# Patient Record
Sex: Male | Born: 1980 | Race: White | Hispanic: No | Marital: Married | Smoking: Current every day smoker
Health system: Southern US, Community
[De-identification: ages and names within clinical notes are randomized; demographics above are authoritative.]

## PROBLEM LIST (undated history)

## (undated) DIAGNOSIS — I82409 Acute embolism and thrombosis of unspecified deep veins of unspecified lower extremity: Secondary | ICD-10-CM

## (undated) DIAGNOSIS — B019 Varicella without complication: Secondary | ICD-10-CM

## (undated) DIAGNOSIS — N200 Calculus of kidney: Secondary | ICD-10-CM

## (undated) DIAGNOSIS — I10 Essential (primary) hypertension: Secondary | ICD-10-CM

## (undated) DIAGNOSIS — K219 Gastro-esophageal reflux disease without esophagitis: Secondary | ICD-10-CM

## (undated) HISTORY — DX: Morbid (severe) obesity due to excess calories: E66.01

## (undated) HISTORY — DX: Acute embolism and thrombosis of unspecified deep veins of unspecified lower extremity: I82.409

## (undated) HISTORY — DX: Calculus of kidney: N20.0

## (undated) HISTORY — DX: Varicella without complication: B01.9

## (undated) HISTORY — DX: Gastro-esophageal reflux disease without esophagitis: K21.9

## (undated) HISTORY — PX: KNEE ARTHROSCOPY W/ MENISCAL REPAIR: SHX1877

---

## 2009-07-07 ENCOUNTER — Ambulatory Visit: Payer: Self-pay | Admitting: Family Medicine

## 2009-07-07 DIAGNOSIS — I10 Essential (primary) hypertension: Secondary | ICD-10-CM | POA: Insufficient documentation

## 2010-03-01 NOTE — Assessment & Plan Note (Signed)
Summary: NOV HTN/ obesity   Vital Signs:  Patient profile:   30 year old male Height:      72 inches Weight:      408 pounds BMI:     55.53 O2 Sat:      99 % on Room air Pulse rate:   96 / minute BP sitting:   122 / 79  (left arm) Cuff size:   large  Vitals Entered By: Payton Spark CMA (July 07, 2009 10:16 AM)  O2 Flow:  Room air CC: New to est. Refill BP med.   Primary Care Provider:  Seymour Bars DO  CC:  New to est. Refill BP med.Marland Kitchen  History of Present Illness: 30 yo WM presents for NOV.  He has been on BP meds x 1 yr.  He was previously seen by Dr Renold Don but he changed insurance.  He is otherwise healthy other than morbid obesity.  It has been <1 yr since he had labs drawn.  He works long hrs and has poor eating habits and no exercise.  Has lost 25 lbs in the past 3 months.  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  Current Medications (verified): 1)  Lisinopril-Hydrochlorothiazide 20-12.5 Mg Tabs (Lisinopril-Hydrochlorothiazide) .... Take 1 Tab By Mouth Once Daily  Allergies (verified): 1)  ! Morphine  Past History:  Past Medical History: obesity HTN  Past Surgical History: L knee arthroscopic surgery  Family History: father HTN, high BP, high chol, DM, CAD, obese mother died in accident sister obese  Social History: Works in Location manager. Finished HS. Married. No kids. Quit smoking 02. No exercise. 1 ETOH/ wk.    Review of Systems       no fevers/sweats/weakness, unexplained wt loss/gain, no change in vision, no difficulty hearing, ringing in ears, no hay fever/allergies, no CP/discomfort, no palpitations, no breast lump/nipple discharge, no cough/wheeze, no blood in stool, no N/V/D, no nocturia, no leaking urine, no unusual vag bleeding, no vaginal/penile discharge, no muscle/joint pain, no rash, no new/changing mole, no HA, no memory loss, no anxiety, no sleep problem, no depression, no unexplained lumps, no easy bruising/bleeding, no concern with sexual  function   Physical Exam  General:  morbidly obese WM in NAD Head:  normocephalic and atraumatic.   Eyes:  pupils equal, pupils round, and pupils reactive to light.   Nose:  no nasal discharge.   Mouth:  pharynx pink and moist.   Neck:  no masses.   Lungs:  normal respiratory effort, no intercostal retractions, and no accessory muscle use.   Heart:  normal rate, regular rhythm, and no murmur.   Extremities:  1+ pitting leg edema bilat Skin:  color normal.   Psych:  good eye contact, not anxious appearing, and not depressed appearing.     Impression & Recommendations:  Problem # 1:  ESSENTIAL HYPERTENSION, BENIGN (ICD-401.1) At goal.  Continue meds.  Not due for labs yet. His updated medication list for this problem includes:    Lisinopril-hydrochlorothiazide 20-12.5 Mg Tabs (Lisinopril-hydrochlorothiazide) .Marland Kitchen... Take 1 tab by mouth once daily  BP today: 122/79  Problem # 2:  MORBID OBESITY (ICD-278.01) BMI 55 c/w morbid obesity. Focus on physical activity and healthier diet. F/u in 3 mos to see how he is doing. Has lost 25 lbs already.  Complete Medication List: 1)  Lisinopril-hydrochlorothiazide 20-12.5 Mg Tabs (Lisinopril-hydrochlorothiazide) .... Take 1 tab by mouth once daily  Patient Instructions: 1)  Stay on BP meds, RFd today. 2)  Will get  your old records for last set of labs. 3)  Keep up the good work with healthy diet.   Avoid fast food, soda and late night snacking. 4)  Try to get as much physical activity each day as you can. 5)  Return for f/u wt/ BP in 3 mos. Prescriptions: LISINOPRIL-HYDROCHLOROTHIAZIDE 20-12.5 MG TABS (LISINOPRIL-HYDROCHLOROTHIAZIDE) Take 1 tab by mouth once daily  #30 x 3   Entered and Authorized by:   Seymour Bars DO   Signed by:   Seymour Bars DO on 07/07/2009   Method used:   Electronically to        [facility]* (retail)       [address]       [place], [place]  [address]       Ph: [phone]       Fax:  [phone]   RxID:   [id number]

## 2015-05-06 ENCOUNTER — Emergency Department: Payer: 59

## 2015-05-06 ENCOUNTER — Emergency Department
Admission: EM | Admit: 2015-05-06 | Discharge: 2015-05-06 | Disposition: A | Discharge status: Home / Self Care | Payer: 59 | Source: Home / Self Care | Attending: Family Medicine | Admitting: Family Medicine

## 2015-05-06 DIAGNOSIS — I8002 Phlebitis and thrombophlebitis of superficial vessels of left lower extremity: Secondary | ICD-10-CM

## 2015-05-06 HISTORY — DX: Essential (primary) hypertension: I10

## 2015-05-06 MED ORDER — DOXYCYCLINE HYCLATE 100 MG PO CAPS: 100.0000 mg | ORAL_CAPSULE | Freq: Two times a day (BID) | ORAL | Status: DC

## 2015-05-06 NOTE — ED Provider Notes (Signed)
CSN: [id number]     Arrival date & time 05/06/15  [id number] History   First MD Initiated Contact with Patient 05/06/15 1023     Chief Complaint  Patient presents with  . Leg Pain      HPI Comments: After working in his yard four days ago, patient developed superficial pain, swelling, and redness of his left lower leg.  No chest pain or shortness of breath.  No fevers, chills, and sweats. He has a history of venous varicosities left lower leg.  He reports that he has had an SVT in the past, but his present symptoms are worse.    Patient is a 35 y.o. male presenting with leg pain. The history is provided by the patient.  Leg Pain Location:  Leg Time since incident:  4 days Injury: no   Leg location:  L lower leg Pain details:    Quality:  Aching   Radiates to:  Does not radiate   Severity:  Mild   Onset quality:  Gradual   Duration:  4 days   Timing:  Constant   Progression:  Worsening Chronicity:  New Prior injury to area:  No Relieved by:  Nothing Worsened by:  Bearing weight Ineffective treatments:  NSAIDs Associated symptoms: swelling   Associated symptoms: no decreased ROM, no fatigue, no fever, no muscle weakness, no numbness, no stiffness and no tingling   Risk factors: obesity     Past Medical History  Diagnosis Date  . Hypertension    Past Surgical History  Procedure Laterality Date  . Knee arthroscopy w/ meniscal repair      Left   Family History  Problem Relation Age of Onset  . Diabetes Father   . Hypertension Father    Social History  Substance Use Topics  . Smoking status: Never Smoker   . Smokeless tobacco: Never Used  . Alcohol Use: No    Review of Systems  Constitutional: Negative for fever, chills, diaphoresis and fatigue.  Respiratory: Negative for cough, chest tightness, shortness of breath and wheezing.   Cardiovascular: Positive for leg swelling. Negative for chest pain.  Musculoskeletal: Negative for stiffness.  All other systems reviewed and  are negative.   Allergies  Morphine  Home Medications   Prior to Admission medications   Medication Sig Start Date End Date Taking? Authorizing Provider  hydrochlorothiazide (HYDRODIURIL) 12.5 MG tablet Take 12.5 mg by mouth daily.   Yes Historical Provider, MD  doxycycline (VIBRAMYCIN) 100 MG capsule Take 1 capsule (100 mg total) by mouth 2 (two) times daily. Take with food. 05/06/15   Kandra Nicolas, MD   Meds Ordered and Administered this Visit  Medications - No data to display  BP 152/95 mmHg  Pulse 91  Temp(Src) 97.5 F (36.4 C) (Oral)  Ht 6' (1.829 m)  Wt 434 lb (196.861 kg)  BMI 58.85 kg/m2  SpO2 100% No data found.   Physical Exam  Constitutional: He is oriented to person, place, and time. He appears well-developed and well-nourished. No distress.  Patient is morbidly obese (BMI 58.9)   HENT:  Head: Normocephalic.  Nose: Nose normal.  Mouth/Throat: Oropharynx is clear and moist.  Eyes: Conjunctivae are normal. Pupils are equal, round, and reactive to light.  Neck: Neck supple.  Cardiovascular: Normal heart sounds.   Pulmonary/Chest: Breath sounds normal.  Abdominal: There is no tenderness.  Musculoskeletal:       Left lower leg: He exhibits tenderness and swelling. He exhibits no bony tenderness.  Legs: There is swelling, erythema, and tenderness around a superficial varicosity left lower leg as noted on diagram. Although there is no left calf tenderness to palpation, patient's left lower leg is generally more swollen and tense than his right lower leg.  Multiple left superficial varicosities are present.    Neurological: He is alert and oriented to person, place, and time.  Skin: Skin is warm and dry.  Nursing note and vitals reviewed.   ED Course  Procedures none  Imaging Review US Venous Img Lower Unilateral Left  05/06/2015  CLINICAL DATA:  Left lower extremity pain and swelling. History of popliteal DVT in 2016. EXAM: Left LOWER EXTREMITY VENOUS  DOPPLER ULTRASOUND TECHNIQUE: Gray-scale sonography with graded compression, as well as color Doppler and duplex ultrasound were performed to evaluate the lower extremity deep venous systems from the level of the common femoral vein and including the common femoral, femoral, profunda femoral, popliteal and calf veins including the posterior tibial, peroneal and gastrocnemius veins when visible. The superficial great saphenous vein was also interrogated. Spectral Doppler was utilized to evaluate flow at rest and with distal augmentation maneuvers in the common femoral, femoral and popliteal veins. COMPARISON:  None. FINDINGS: Contralateral Common Femoral Vein: Respiratory phasicity is normal and symmetric with the symptomatic side. No evidence of thrombus. Normal compressibility. Common Femoral Vein: No evidence of thrombus. Saphenofemoral Junction: No evidence of thrombus. Profunda Femoral Vein: No evidence of thrombus. Femoral Vein: No evidence of thrombus. Popliteal Vein: No evidence of thrombus. Deep calf Veins: No evidence of thrombus. In the symptomatic area is subcutaneous calf varices complicated by occlusive thrombosis with echogenic expansile clot. IMPRESSION: 1. 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 2. No evidence of left lower extremity deep venous thrombosis. Electronically Signed   By: Monte Fantasia M.D.   On: 05/06/2015 12:08      MDM   1. Superficial thrombophlebitis of leg, left; no evidence DVT   Begin doxycycline 100mg  BID for staph coverage. Elevate leg as much as possible.  Apply heating pad every 2 to 3 hours until improved.  Take Ibuprofen 200mg , 4 tabs every 8 hours with food.  If symptoms become significantly worse during the night or over the weekend, proceed to the local emergency room.  Followup with Family Doctor if not improved in one week.     Kandra Nicolas, MD 05/06/15 [id number]

## 2015-05-06 NOTE — ED Notes (Signed)
Cleaning the house and mowing on Sunday, Spot midwaybetween left knee and ankle became red, hot, and sensitive to the touch.  Has a varicose vein that becomes sensitive before, but hurt much worse this time.  Less painful today than yesterday, and patient stated there was more redness yesterday than today.Marland Kitchen

## 2015-05-06 NOTE — Discharge Instructions (Signed)
Elevate leg as much as possible.  Apply heating pad every 2 to 3 hours until improved.  Take Ibuprofen 200mg , 4 tabs every 8 hours with food.  If symptoms become significantly worse during the night or over the weekend, proceed to the local emergency room.    Phlebitis Phlebitis is soreness and swelling (inflammation) of a vein. This can occur in your arms, legs, or torso (trunk), as well as deeper inside your body. Phlebitis is usually not serious when it occurs close to the surface of the body. However, it can cause serious problems when it occurs in a vein deeper inside the body. CAUSES  Phlebitis can be triggered by various things, including:   Reduced blood flow through your veins. This can happen with:  Bed rest over a long period.  Long-distance travel.  Injury.  Surgery.  Being overweight (obese) or pregnant.  Having an IV tube put in the vein and getting certain medicines through the vein.  Cancer and cancer treatment.  Use of illegal drugs taken through the vein.  Inflammatory diseases.  Inherited (genetic) diseases that increase the risk of blood clots.  Hormone therapy, such as birth control pills. SIGNS AND SYMPTOMS   Red, tender, swollen, and painful area on your skin. Usually, the area will be long and narrow.  Firmness along the center of the affected area. This can indicate that a blood clot has formed.  Low-grade fever. DIAGNOSIS  A health care provider can usually diagnose phlebitis by examining the affected area and asking about your symptoms. To check for infection or blood clots, your health care provider may order blood tests or an ultrasound exam of the area. Blood tests and your family history may also indicate if you have an underlying genetic disease that causes blood clots. Occasionally, a piece of tissue is taken from the body (biopsy sample) if an unusual cause of phlebitis is suspected. TREATMENT  Treatment will vary depending on the severity  of the condition and the area of the body affected. Treatment may include:  Use of a warm compress or heating pad.  Use of compression stockings or bandages.  Anti-inflammatory medicines.  Removal of any IV tube that may be causing the problem.  Medicines that kill germs (antibiotics) if an infection is present.  Blood-thinning medicines if a blood clot is suspected or present.  In rare cases, surgery may be needed to remove damaged sections of vein. HOME CARE INSTRUCTIONS   Only take over-the-counter or prescription medicines as directed by your health care provider. Take all medicines exactly as prescribed.  Raise (elevate) the affected area above the level of your heart as directed by your health care provider.  Apply a warm compress or heating pad to the affected area as directed by your health care provider. Do not sleep with the heating pad.  Use compression stockings or bandages as directed. These will speed healing and prevent the condition from coming back.  If you are on blood thinners:  Get follow-up blood tests as directed by your health care provider.  Check with your health care provider before using any new medicines.  Carry a medical alert card or wear your medical alert jewelry to show that you are on blood thinners.  For phlebitis in the legs:  Avoid prolonged standing or bed rest.  Keep your legs moving. Raise your legs when sitting or lying.  Do not smoke.  Women, particularly those over the age of 67, should consider the risks and benefits  of taking the contraceptive pill. This kind of hormone treatment can increase your risk for blood clots.  Follow up with your health care provider as directed. SEEK MEDICAL CARE IF:   You have unusual bruising or any bleeding problems.  Your swelling or pain in the affected area is not improving.  You are on anti-inflammatory medicine, and you develop belly (abdominal) pain. SEEK IMMEDIATE MEDICAL CARE IF:    You have a sudden onset of chest pain or difficulty breathing.  You have a fever or persistent symptoms for more than 2-3 days.  You have a fever and your symptoms suddenly get worse. MAKE SURE YOU:  Understand these instructions.  Will watch your condition.  Will get help right away if you are not doing well or get worse.   This information is not intended to replace advice given to you by your health care provider. Make sure you discuss any questions you have with your health care provider.   Document Released: 01/10/2001 Document Revised: 11/06/2012 Document Reviewed: 09/23/2012 Elsevier Interactive Patient Education [employer].

## 2015-05-09 ENCOUNTER — Telehealth: Payer: Self-pay | Admitting: Emergency Medicine

## 2016-07-11 ENCOUNTER — Ambulatory Visit (INDEPENDENT_AMBULATORY_CARE_PROVIDER_SITE_OTHER): Payer: 59 | Admitting: Adult Health

## 2016-07-11 ENCOUNTER — Encounter: Payer: Self-pay | Admitting: Adult Health

## 2016-07-11 DIAGNOSIS — K2100 Gastro-esophageal reflux disease with esophagitis, without bleeding: Secondary | ICD-10-CM

## 2016-07-11 DIAGNOSIS — Z7689 Persons encountering health services in other specified circumstances: Secondary | ICD-10-CM | POA: Diagnosis not present

## 2016-07-11 DIAGNOSIS — I10 Essential (primary) hypertension: Secondary | ICD-10-CM | POA: Diagnosis not present

## 2016-07-11 MED ORDER — OMEPRAZOLE 20 MG PO CPDR: 20.0000 mg | DELAYED_RELEASE_CAPSULE | Freq: Every day | ORAL | 3 refills | Status: DC

## 2016-07-11 NOTE — Progress Notes (Signed)
Patient presents to clinic today to establish care. He is a pleasant 36 year old male who  has a past medical history of Chicken pox; DVT (deep venous thrombosis) ([place]); GERD (gastroesophageal reflux disease); Hypertension; Kidney stones; and Morbid obesity ([place]).   He is due for his CPE    Acute Concerns: Establish Care   Chronic Issues: Hypertension - He takes lisinopril 20-12.5 mg   Sleep Apnea - he uses CPap.   GERD - he reports chronic GERD symptoms. He uses TUMS daily and this does not help. He does see slight change in his symptoms when he changes diet. He wakes up with a sour taste in his mouth and has heart burn  Morbid obesity - he has been battling weight loss for " 15 years". He has tried multiple diet but finds it difficult to stay on track with them. He is interested in seeing someone for weight loss.   Health Maintenance: Dental -- Routine Care  Vision -- Does not do routine care  Immunizations -- UTD  Colonoscopy -- Never had  Diet: He tries to eat healthy but finds it difficult to stay on tract with a healthy diet  Exercise: He has joined [employer] and has started going.   Past Medical History:  Diagnosis Date  . Chicken pox   . DVT (deep venous thrombosis) ([place])   . GERD (gastroesophageal reflux disease)   . Hypertension   . Kidney stones   . Morbid obesity ([place])     Past Surgical History:  Procedure Laterality Date  . KNEE ARTHROSCOPY W/ MENISCAL REPAIR     Left    No current outpatient prescriptions on file prior to visit.   No current facility-administered medications on file prior to visit.     Allergies  Allergen Reactions  . Morphine   . Requip [Ropinirole Hcl]     Family History  Problem Relation Age of Onset  . Diabetes Father   . Hypertension Father   . Arthritis Father   . Heart disease Father   . Breast cancer Maternal Grandmother     Social History   Social History  . Marital status: Single    Spouse name: N/A    . Number of children: N/A  . Years of education: N/A   Occupational History  . Not on file.   Social History Main Topics  . Smoking status: Never Smoker  . Smokeless tobacco: Never Used  . Alcohol use No  . Drug use: No  . Sexual activity: Not on file   Other Topics Concern  . Not on file   Social History Narrative   High School education        Review of Systems  Constitutional: Negative.   HENT: Negative.   Eyes: Negative.   Respiratory: Negative.   Cardiovascular: Negative.   Gastrointestinal: Negative.   Genitourinary: Negative.   Musculoskeletal: Positive for back pain and joint pain.  Skin: Negative.   Neurological: Negative.   Endo/Heme/Allergies: Negative.   Psychiatric/Behavioral: Negative.   All other systems reviewed and are negative.   BP 126/80 (BP Location: Left Arm, Patient Position: Sitting, Cuff Size: Large)   Temp 98.3 F (36.8 C) (Oral)   Ht 6' (1.829 m)   Wt (!) 440 lb (199.6 kg)   BMI 59.67 kg/m   Physical Exam  Constitutional: He is oriented to person, place, and time and well-developed, well-nourished, and in no distress. No distress.  Morbidly obese    Cardiovascular:  Normal rate, regular rhythm, normal heart sounds and intact distal pulses.  Exam reveals no gallop and no friction rub.   No murmur heard. Pulmonary/Chest: Effort normal and breath sounds normal. No respiratory distress. He has no wheezes. He has no rales. He exhibits no tenderness.  Neurological: He is alert and oriented to person, place, and time. Gait normal. GCS score is 15.  Skin: Skin is warm and dry. No rash noted. He is not diaphoretic. No erythema. No pallor.  Psychiatric: Mood, memory, affect and judgment normal.  Nursing note and vitals reviewed.   Assessment/Plan: 1. Encounter to establish care - Follow up for CPE or sooner if needed - Work on diet and start exercising  2. Morbid obesity ([place])  - Amb Ref to Medical Weight Management  3. Essential  hypertension, benign  - lisinopril-hydrochlorothiazide (PRINZIDE,ZESTORETIC) 20-12.5 MG tablet; Take 1 tablet by mouth daily.  4. Gastroesophageal reflux disease with esophagitis  - omeprazole (PRILOSEC) 20 MG capsule; Take 1 capsule (20 mg total) by mouth daily.  Dispense: 90 capsule; Refill: 3

## 2016-07-11 NOTE — Patient Instructions (Signed)
It was great meeting you today   Please follow up for your physical   Someone from weight management will call you to schedule your appointment

## 2016-08-22 ENCOUNTER — Encounter: Payer: Self-pay | Admitting: Adult Health

## 2016-08-22 ENCOUNTER — Ambulatory Visit (INDEPENDENT_AMBULATORY_CARE_PROVIDER_SITE_OTHER): Payer: 59 | Admitting: Adult Health

## 2016-08-22 VITALS — BP 128/78 | HR 90 | Temp 97.7°F | Ht 72.0 in | Wt >= 6400 oz

## 2016-08-22 DIAGNOSIS — I10 Essential (primary) hypertension: Secondary | ICD-10-CM

## 2016-08-22 DIAGNOSIS — Z0000 Encounter for general adult medical examination without abnormal findings: Secondary | ICD-10-CM | POA: Diagnosis not present

## 2016-08-22 LAB — CBC WITH DIFFERENTIAL/PLATELET
BASOS PCT: 0.7 % (ref 0.0–3.0)
Basophils Absolute: 0.1 10*3/uL (ref 0.0–0.1)
EOS PCT: 1.7 % (ref 0.0–5.0)
Eosinophils Absolute: 0.2 10*3/uL (ref 0.0–0.7)
HEMATOCRIT: 42.5 % (ref 39.0–52.0)
HEMOGLOBIN: 14.1 g/dL (ref 13.0–17.0)
LYMPHS PCT: 29.8 % (ref 12.0–46.0)
Lymphs Abs: 2.7 10*3/uL (ref 0.7–4.0)
MCHC: 33.3 g/dL (ref 30.0–36.0)
MCV: 92.6 fl (ref 78.0–100.0)
MONO ABS: 0.8 10*3/uL (ref 0.1–1.0)
MONOS PCT: 8.8 % (ref 3.0–12.0)
Neutro Abs: 5.3 10*3/uL (ref 1.4–7.7)
Neutrophils Relative %: 59 % (ref 43.0–77.0)
Platelets: 266 10*3/uL (ref 150.0–400.0)
RBC: 4.59 Mil/uL (ref 4.22–5.81)
RDW: 13.2 % (ref 11.5–15.5)
WBC: 8.9 10*3/uL (ref 4.0–10.5)

## 2016-08-22 LAB — LIPID PANEL
CHOLESTEROL: 168 mg/dL (ref 0–200)
HDL: 46.6 mg/dL (ref >39.00)
LDL Cholesterol: 111 mg/dL — ABNORMAL HIGH (ref 0–99)
NonHDL: 121.27
Total CHOL/HDL Ratio: 4
Triglycerides: 52 mg/dL (ref 0.0–149.0)
VLDL: 10.4 mg/dL (ref 0.0–40.0)

## 2016-08-22 LAB — BASIC METABOLIC PANEL
BUN: 16 mg/dL (ref 6–23)
CHLORIDE: 106 meq/L (ref 96–112)
CO2: 30 mEq/L (ref 19–32)
Calcium: 9.2 mg/dL (ref 8.4–10.5)
Creatinine, Ser: 0.92 mg/dL (ref 0.40–1.50)
GFR: 98.06 mL/min (ref >60.00)
Glucose, Bld: 100 mg/dL — ABNORMAL HIGH (ref 70–99)
POTASSIUM: 4.6 meq/L (ref 3.5–5.1)
Sodium: 143 mEq/L (ref 135–145)

## 2016-08-22 LAB — HEPATIC FUNCTION PANEL
ALBUMIN: 4 g/dL (ref 3.5–5.2)
ALT: 28 U/L (ref 0–53)
AST: 13 U/L (ref 0–37)
Alkaline Phosphatase: 65 U/L (ref 39–117)
Bilirubin, Direct: 0.1 mg/dL (ref 0.0–0.3)
Total Bilirubin: 0.5 mg/dL (ref 0.2–1.2)
Total Protein: 6.3 g/dL (ref 6.0–8.3)

## 2016-08-22 LAB — VITAMIN B12: VITAMIN B 12: 343 pg/mL (ref 211–911)

## 2016-08-22 LAB — HEMOGLOBIN A1C: Hgb A1c MFr Bld: 5.6 % (ref 4.6–6.5)

## 2016-08-22 LAB — VITAMIN D 25 HYDROXY (VIT D DEFICIENCY, FRACTURES): VITD: 16.26 ng/mL — ABNORMAL LOW (ref 30.00–100.00)

## 2016-08-22 LAB — TSH: TSH: 1.35 u[IU]/mL (ref 0.35–4.50)

## 2016-08-22 MED ORDER — LISINOPRIL-HYDROCHLOROTHIAZIDE 20-12.5 MG PO TABS: 1.0000 | ORAL_TABLET | Freq: Every day | ORAL | 0 refills | Status: DC

## 2016-08-22 MED ORDER — PHENTERMINE HCL 15 MG PO CAPS: 15.0000 mg | ORAL_CAPSULE | ORAL | 0 refills | Status: DC

## 2016-08-22 MED ORDER — PHENTERMINE HCL 15 MG PO CAPS: 15.0000 mg | ORAL_CAPSULE | ORAL | 3 refills | Status: DC

## 2016-08-22 NOTE — Patient Instructions (Addendum)
It was great seeing you today   I will follow up with you regarding your blood work   I have prescribed phentermine to help with weight loss. Please work on diet and exercising.   Follow up in one month for weight loss management

## 2016-08-22 NOTE — Progress Notes (Signed)
Subjective:    Patient ID: Jordan Trujillo, male    DOB: 1980/02/05, 36 y.o.   MRN: [id number]  HPI  Patient presents for yearly preventative medicine examination. He is a pleasant 36 year old male who  has a past medical history of Chicken pox; DVT (deep venous thrombosis) ([place]); GERD (gastroesophageal reflux disease); Hypertension; Kidney stones; and Morbid obesity ([place]).  All immunizations and health maintenance protocols were reviewed with the patient and needed orders were placed.  Appropriate screening laboratory values were ordered for the patient including screening of hyperlipidemia, renal function and hepatic function. If indicated by BPH, a PSA was ordered.  Medication reconciliation,  past medical history, social history, problem list and allergies were reviewed in detail with the patient  Goals were established with regard to weight loss, exercise, and  diet in compliance with medications. He reports that he is unsure of when he will get into the weight loss clinic as they do not have any open spots. He is trying different diets and is active at work. Unfortunately, he has gone up 7 pounds since the last visit.   He  takes Lisinopril- HCTZ for blood pressure control - today in the office his blood pressure is 128/78  He has been referred to Weight Loss clinic but has not had his initial appointment yet   He also takes Prilosec 20 mg for GERD     Review of Systems  Constitutional: Negative.   HENT: Negative.   Eyes: Negative.   Respiratory: Negative.   Cardiovascular: Negative.   Gastrointestinal: Negative.   Endocrine: Negative.   Genitourinary: Negative.   Musculoskeletal: Negative.   Skin: Negative.   Allergic/Immunologic: Negative.   Neurological: Negative.   Hematological: Negative.   Psychiatric/Behavioral: Negative.   All other systems reviewed and are negative.  Past Medical History:  Diagnosis Date  . Chicken pox   . DVT (deep venous thrombosis)  ([place])   . GERD (gastroesophageal reflux disease)   . Hypertension   . Kidney stones   . Morbid obesity ([place])     Social History   Social History  . Marital status: Married    Spouse name: N/A  . Number of children: N/A  . Years of education: N/A   Occupational History  . Not on file.   Social History Main Topics  . Smoking status: Never Smoker  . Smokeless tobacco: Never Used  . Alcohol use No  . Drug use: No  . Sexual activity: Not on file   Other Topics Concern  . Not on file   Social History Narrative   High School education        Past Surgical History:  Procedure Laterality Date  . KNEE ARTHROSCOPY W/ MENISCAL REPAIR     Left    Family History  Problem Relation Age of Onset  . Diabetes Father   . Hypertension Father   . Arthritis Father   . Heart disease Father   . Breast cancer Maternal Grandmother     Allergies  Allergen Reactions  . Morphine   . Requip [Ropinirole Hcl]     Current Outpatient Prescriptions on File Prior to Visit  Medication Sig Dispense Refill  . lisinopril-hydrochlorothiazide (PRINZIDE,ZESTORETIC) 20-12.5 MG tablet Take 1 tablet by mouth daily.    Marland Kitchen omeprazole (PRILOSEC) 20 MG capsule Take 1 capsule (20 mg total) by mouth daily. 90 capsule 3   No current facility-administered medications on file prior to visit.     There were no  vitals taken for this visit.      Objective:   Physical Exam  Constitutional: He is oriented to person, place, and time. He appears well-developed and well-nourished. No distress.  HENT:  Head: Normocephalic and atraumatic.  Right Ear: External ear normal.  Left Ear: External ear normal.  Nose: Nose normal.  Mouth/Throat: Oropharynx is clear and moist. No oropharyngeal exudate.  Eyes: Pupils are equal, round, and reactive to light. Conjunctivae are normal. Right eye exhibits no discharge. Left eye exhibits no discharge. No scleral icterus.  Neck: Normal range of motion. Neck supple. No JVD  present. No tracheal deviation present. No thyromegaly present.  Cardiovascular: Normal rate, regular rhythm, normal heart sounds and intact distal pulses.  Exam reveals no gallop and no friction rub.   No murmur heard. Pulmonary/Chest: Effort normal and breath sounds normal. No stridor. No respiratory distress. He has no wheezes. He has no rales. He exhibits no tenderness.  Abdominal: Soft. Bowel sounds are normal. He exhibits no distension and no mass. There is no tenderness. There is no rebound and no guarding.  Morbidly obese   Musculoskeletal: Normal range of motion. He exhibits no edema, tenderness or deformity.  Lymphadenopathy:    He has no cervical adenopathy.  Neurological: He is alert and oriented to person, place, and time. He has normal reflexes. He displays normal reflexes. No cranial nerve deficit. He exhibits normal muscle tone. Coordination normal.  Skin: Skin is warm and dry. No rash noted. He is not diaphoretic. No erythema. No pallor.  Psychiatric: He has a normal mood and affect. His behavior is normal. Judgment and thought content normal.  Nursing note and vitals reviewed.     Assessment & Plan:  1. Routine general medical examination at a health care facility  - Basic metabolic panel - CBC with Differential/Platelet - Hemoglobin A1c - Hepatic function panel - Lipid panel - TSH  2. Essential hypertension, benign - well controlled.  - Basic metabolic panel - CBC with Differential/Platelet - Hemoglobin A1c - Hepatic function panel - Lipid panel - TSH - lisinopril-hydrochlorothiazide (PRINZIDE,ZESTORETIC) 20-12.5 MG tablet; Take 1 tablet by mouth daily.  Dispense: 90 tablet; Refill: 0  3. MORBID OBESITY - We spoke about various options regarding obesity, including bariatric surgery. He would like to try a weight loss medication prior to going to see bariatric surgery. He needs to continue to work on diet and start exercising outside of work  - Waist  circumference is > 60 inchs and neck circumference is 22 inchs - Basic metabolic panel - CBC with Differential/Platelet - Hemoglobin A1c - Hepatic function panel - Lipid panel - TSH - Insulin, Fasting - Vitamin B12 - Vitamin D, 25-hydroxy - phentermine 15 MG capsule; Take 1 capsule (15 mg total) by mouth every morning.  Dispense: 30 capsule; Refill: 0 - Follow up in one month   [name], NP

## 2016-08-23 LAB — INSULIN, FASTING: Insulin fasting, serum: 31.8 u[IU]/mL — ABNORMAL HIGH (ref 2.0–19.6)

## 2016-09-19 ENCOUNTER — Other Ambulatory Visit: Payer: Self-pay | Admitting: Family Medicine

## 2016-09-19 ENCOUNTER — Encounter: Payer: Self-pay | Admitting: Adult Health

## 2016-09-19 ENCOUNTER — Ambulatory Visit (INDEPENDENT_AMBULATORY_CARE_PROVIDER_SITE_OTHER): Payer: 59 | Admitting: Adult Health

## 2016-09-19 ENCOUNTER — Ambulatory Visit: Payer: 59 | Admitting: Adult Health

## 2016-09-19 DIAGNOSIS — I10 Essential (primary) hypertension: Secondary | ICD-10-CM

## 2016-09-19 DIAGNOSIS — K2100 Gastro-esophageal reflux disease with esophagitis, without bleeding: Secondary | ICD-10-CM

## 2016-09-19 MED ORDER — LISINOPRIL-HYDROCHLOROTHIAZIDE 20-12.5 MG PO TABS: 1.0000 | ORAL_TABLET | Freq: Every day | ORAL | 3 refills | Status: DC

## 2016-09-19 MED ORDER — OMEPRAZOLE 20 MG PO CPDR: 20.0000 mg | DELAYED_RELEASE_CAPSULE | Freq: Every day | ORAL | 2 refills | Status: DC

## 2016-09-19 MED ORDER — PHENTERMINE HCL 15 MG PO CAPS: 15.0000 mg | ORAL_CAPSULE | ORAL | 0 refills | Status: DC

## 2016-09-19 NOTE — Progress Notes (Deleted)
Subjective:    Patient ID: Jordan Trujillo, male    DOB: [birth year], 36 y.o.   MRN: [id number]  HPI  36 year old male who  has a past medical history of Chicken pox; DVT (deep venous thrombosis) ([place]); GERD (gastroesophageal reflux disease); Hypertension; Kidney stones; and Morbid obesity ([place]). H presents to the office today for one month follow up after starting Phentermine for morbid obesity. He has also been referred to Dr. Dennard Nip at medical weight loss clinic but he has not had his initial appointment yet. During his last visit he was started on Phentermine 15 mg to aid him in weight loss.   In the office today he reports that     Review of Systems See HPI  Past Medical History:  Diagnosis Date  . Chicken pox   . DVT (deep venous thrombosis) ([place])   . GERD (gastroesophageal reflux disease)   . Hypertension   . Kidney stones   . Morbid obesity ([place])     Social History   Social History  . Marital status: Married    Spouse name: N/A  . Number of children: N/A  . Years of education: N/A   Occupational History  . Not on file.   Social History Main Topics  . Smoking status: Never Smoker  . Smokeless tobacco: Never Used  . Alcohol use No  . Drug use: No  . Sexual activity: Not on file   Other Topics Concern  . Not on file   Social History Narrative   High School education        Past Surgical History:  Procedure Laterality Date  . KNEE ARTHROSCOPY W/ MENISCAL REPAIR     Left    Family History  Problem Relation Age of Onset  . Diabetes Father   . Hypertension Father   . Arthritis Father   . Heart disease Father   . Breast cancer Maternal Grandmother     Allergies  Allergen Reactions  . Morphine   . Requip [Ropinirole Hcl]     Current Outpatient Prescriptions on File Prior to Visit  Medication Sig Dispense Refill  . lisinopril-hydrochlorothiazide (PRINZIDE,ZESTORETIC) 20-12.5 MG tablet Take 1 tablet by mouth daily. 90 tablet 0  .  omeprazole (PRILOSEC) 20 MG capsule Take 1 capsule (20 mg total) by mouth daily. 90 capsule 3  . phentermine 15 MG capsule Take 1 capsule (15 mg total) by mouth every morning. 30 capsule 0   No current facility-administered medications on file prior to visit.     There were no vitals taken for this visit.      Objective:   Physical Exam  Constitutional: He is oriented to person, place, and time. He appears well-developed and well-nourished. No distress.  HENT:  Head: Normocephalic and atraumatic.  Right Ear: External ear normal.  Left Ear: External ear normal.  Nose: Nose normal.  Mouth/Throat: Oropharynx is clear and moist. No oropharyngeal exudate.  Eyes: Pupils are equal, round, and reactive to light. Conjunctivae and EOM are normal. Right eye exhibits no discharge. Left eye exhibits no discharge.  Neck: Normal range of motion. Neck supple. No JVD present. No tracheal deviation present. No thyromegaly present.  Cardiovascular: Normal rate, regular rhythm, normal heart sounds and intact distal pulses.  Exam reveals no gallop and no friction rub.   No murmur heard. Pulmonary/Chest: Effort normal and breath sounds normal. No stridor. No respiratory distress. He has no wheezes. He has no rales. He exhibits no tenderness.  Abdominal:  Soft. Bowel sounds are normal. He exhibits no distension and no mass. There is no tenderness. There is no rebound and no guarding.  Musculoskeletal: Normal range of motion. He exhibits no edema, tenderness or deformity.  Lymphadenopathy:    He has no cervical adenopathy.  Neurological: He is alert and oriented to person, place, and time. He has normal reflexes. He displays normal reflexes. No cranial nerve deficit. He exhibits normal muscle tone. Coordination normal.  Skin: Skin is warm and dry. No rash noted. He is not diaphoretic. No erythema. No pallor.  Psychiatric: He has a normal mood and affect. His behavior is normal. Judgment and thought content  normal.  Nursing note and vitals reviewed.     Assessment & Plan:

## 2016-09-19 NOTE — Telephone Encounter (Signed)
Filled to local pharmacy for 1 year on 07/17/16.  Now sent to [facility] by e-scribe.

## 2016-09-19 NOTE — Progress Notes (Signed)
Subjective:    Patient ID: Jordan Trujillo, male    DOB: 1980/03/03, 36 y.o.   MRN: [id number]  HPI  36 year old male who  has a past medical history of Chicken pox; DVT (deep venous thrombosis) ([place]); GERD (gastroesophageal reflux disease); Hypertension; Kidney stones; and Morbid obesity ([place]). He presents to the office today for one month follow up regarding weight loss. During his last visit he was started on Phentermine 15 mg to help in his weight loss in combination with diet and exercise. His starting weight was 447. Today in the office he is 418 lbs. He has lost 29 pounds  Today he reports that he has been doing the keto diet and is enjoying that. He is also more active and has been taking his dogs on more frequent walks. He reports " I am actually feeling great and have a lot more energy. He has not had any side effects of phentermine   Neck circumference is 48 cm/19 in during this visit   Review of Systems   See HPI    Past Medical History:  Diagnosis Date  . Chicken pox   . DVT (deep venous thrombosis) ([place])   . GERD (gastroesophageal reflux disease)   . Hypertension   . Kidney stones   . Morbid obesity ([place])     Social History   Social History  . Marital status: Married    Spouse name: N/A  . Number of children: N/A  . Years of education: N/A   Occupational History  . Not on file.   Social History Main Topics  . Smoking status: Never Smoker  . Smokeless tobacco: Never Used  . Alcohol use No  . Drug use: No  . Sexual activity: Not on file   Other Topics Concern  . Not on file   Social History Narrative   High School education        Past Surgical History:  Procedure Laterality Date  . KNEE ARTHROSCOPY W/ MENISCAL REPAIR     Left    Family History  Problem Relation Age of Onset  . Diabetes Father   . Hypertension Father   . Arthritis Father   . Heart disease Father   . Breast cancer Maternal Grandmother     Allergies  Allergen  Reactions  . Morphine   . Requip [Ropinirole Hcl]     Current Outpatient Prescriptions on File Prior to Visit  Medication Sig Dispense Refill  . lisinopril-hydrochlorothiazide (PRINZIDE,ZESTORETIC) 20-12.5 MG tablet Take 1 tablet by mouth daily. 90 tablet 0  . omeprazole (PRILOSEC) 20 MG capsule Take 1 capsule (20 mg total) by mouth daily. 90 capsule 2   No current facility-administered medications on file prior to visit.     BP 122/74 (BP Location: Left Arm)   Temp 97.9 F (36.6 C) (Oral)   Wt (!) 418 lb (189.6 kg)   BMI 56.69 kg/m       Objective:   Physical Exam  Constitutional: He is oriented to person, place, and time. He appears well-developed and well-nourished. No distress.  Morbidly obese   Cardiovascular: Normal rate, regular rhythm, normal heart sounds and intact distal pulses.  Exam reveals no gallop and no friction rub.   No murmur heard. Pulmonary/Chest: Effort normal and breath sounds normal. No respiratory distress. He has no wheezes. He has no rales. He exhibits no tenderness.  Neurological: He is alert and oriented to person, place, and time.  Skin: Skin is warm and dry.  No rash noted. He is not diaphoretic. No erythema. No pallor.  Psychiatric: He has a normal mood and affect. His behavior is normal. Thought content normal.  Nursing note and vitals reviewed.     Assessment & Plan:  1. MORBID OBESITY - He is doing great the first month. Keep up the good work. Return in one month or sooner if needed - phentermine 15 MG capsule; Take 1 capsule (15 mg total) by mouth every morning.  Dispense: 30 capsule; Refill: 0  2. Essential hypertension, benign  - lisinopril-hydrochlorothiazide (PRINZIDE,ZESTORETIC) 20-12.5 MG tablet; Take 1 tablet by mouth daily.  Dispense: 90 tablet; Refill: 3   [name], NP

## 2016-09-19 NOTE — Patient Instructions (Signed)
It was great seeing you today   You are doing great with the weight loss. Keep up the good work!  I am going to continue with Phentermine 15 mg   Follow up in one month

## 2016-10-20 ENCOUNTER — Encounter: Payer: Self-pay | Admitting: Adult Health

## 2016-10-24 ENCOUNTER — Encounter: Payer: Self-pay | Admitting: Adult Health

## 2016-10-24 ENCOUNTER — Ambulatory Visit (INDEPENDENT_AMBULATORY_CARE_PROVIDER_SITE_OTHER): Payer: 59 | Admitting: Adult Health

## 2016-10-24 MED ORDER — PHENTERMINE HCL 15 MG PO CAPS: 15.0000 mg | ORAL_CAPSULE | ORAL | 0 refills | Status: DC

## 2016-10-24 NOTE — Progress Notes (Signed)
Subjective:    Patient ID: Jordan Trujillo, male    DOB: 04/16/1980, 36 y.o.   MRN: [id number]  HPI  36 year old male who  has a past medical history of Chicken pox; DVT (deep venous thrombosis) ([place]); GERD (gastroesophageal reflux disease); Hypertension; Kidney stones; and Morbid obesity ([place]). He presents to the clinic today for one month follow up regarding morbid obesity. When I last saw him, it was for a one month follow up after starting Phentermine to help with with weight loss. He reported that he had started eating health ( keto diet) and was walking his dogs most often throughout the day. He had been able to lose 29 pounds in that month.   Today in the office he reports that he continues to enjoy the keto diet and has been working out more at the gym. He is disappointed that he did not make his goal of being in the 300 pound range. He has been stuck for the last 2 weeks in the low 400's. He has started to do more aerobic exercise at the gym, instead of weight lifting.   He also reports that he has been sleeping better and has noticed that " I am not fighting my CPAP any longer.   Wt Readings from Last 3 Encounters:  10/24/16 (!) 406 lb 3.2 oz (184.3 kg)  09/19/16 (!) 418 lb (189.6 kg)  08/22/16 (!) 447 lb (202.8 kg)   Neck Circumfrance   09/19/2016 - 19 in 10/24/2016 - 18.75 in  Review of Systems  Constitutional: Negative.   Respiratory: Negative.   Cardiovascular: Negative.   Genitourinary: Negative.   Musculoskeletal: Negative.   Skin: Negative.   Neurological: Negative.   Hematological: Negative.   Psychiatric/Behavioral: Negative.   All other systems reviewed and are negative.  Past Medical History:  Diagnosis Date  . Chicken pox   . DVT (deep venous thrombosis) ([place])   . GERD (gastroesophageal reflux disease)   . Hypertension   . Kidney stones   . Morbid obesity ([place])     Social History   Social History  . Marital status: Married    Spouse name: N/A    . Number of children: N/A  . Years of education: N/A   Occupational History  . Not on file.   Social History Main Topics  . Smoking status: Never Smoker  . Smokeless tobacco: Never Used  . Alcohol use No  . Drug use: No  . Sexual activity: Not on file   Other Topics Concern  . Not on file   Social History Narrative   High School education        Past Surgical History:  Procedure Laterality Date  . KNEE ARTHROSCOPY W/ MENISCAL REPAIR     Left    Family History  Problem Relation Age of Onset  . Diabetes Father   . Hypertension Father   . Arthritis Father   . Heart disease Father   . Breast cancer Maternal Grandmother     Allergies  Allergen Reactions  . Morphine   . Requip [Ropinirole Hcl]     Current Outpatient Prescriptions on File Prior to Visit  Medication Sig Dispense Refill  . lisinopril-hydrochlorothiazide (PRINZIDE,ZESTORETIC) 20-12.5 MG tablet Take 1 tablet by mouth daily. 90 tablet 3  . omeprazole (PRILOSEC) 20 MG capsule Take 1 capsule (20 mg total) by mouth daily. 90 capsule 2  . phentermine 15 MG capsule Take 1 capsule (15 mg total) by mouth every morning. [place]  capsule 0   No current facility-administered medications on file prior to visit.     BP 112/80 (BP Location: Left Wrist, Patient Position: Sitting, Cuff Size: Normal)   Temp 98.1 F (36.7 C) (Oral)   Wt (!) 406 lb 3.2 oz (184.3 kg)   BMI 55.09 kg/m       Objective:   Physical Exam  Constitutional: He is oriented to person, place, and time. He appears well-developed and well-nourished. No distress.  Morbidly obese   Cardiovascular: Normal rate, regular rhythm, normal heart sounds and intact distal pulses.  Exam reveals no gallop and no friction rub.   No murmur heard. Pulmonary/Chest: Effort normal and breath sounds normal. No respiratory distress. He has no wheezes. He has no rales. He exhibits no tenderness.  Musculoskeletal: Normal range of motion. He exhibits no edema,  tenderness or deformity.  Neurological: He is alert and oriented to person, place, and time.  Skin: Skin is warm and dry. No rash noted. He is not diaphoretic. No erythema. No pallor.  Psychiatric: He has a normal mood and affect. His behavior is normal. Judgment and thought content normal.  Nursing note and vitals reviewed.     Assessment & Plan:  1. MORBID OBESITY - Advised that he is starting to put on some muscle mass and that this is probably why he is has noticed the tailing off on some of his weight loss. Continue to work on diet and exercise.  - He has lost 41 pounds so far  - phentermine 15 MG capsule; Take 1 capsule (15 mg total) by mouth every morning.  Dispense: 30 capsule; Refill: 0 - Follow up in 1 month   [name], NP

## 2016-11-21 ENCOUNTER — Ambulatory Visit (INDEPENDENT_AMBULATORY_CARE_PROVIDER_SITE_OTHER): Payer: 59 | Admitting: Adult Health

## 2016-11-21 ENCOUNTER — Ambulatory Visit: Payer: 59 | Admitting: Adult Health

## 2016-11-21 MED ORDER — PHENTERMINE HCL 30 MG PO CAPS: 30.0000 mg | ORAL_CAPSULE | ORAL | 0 refills | Status: DC

## 2016-11-21 NOTE — Progress Notes (Signed)
Subjective:    Patient ID: Jordan Trujillo, male    DOB: [birth year], 36 y.o.   MRN: [id number]  HPI  36 year old male who  has a past medical history of Chicken pox; DVT (deep venous thrombosis) ([place]); GERD (gastroesophageal reflux disease); Hypertension; Kidney stones; and Morbid obesity ([place]).   He presents to the office today for follow up regarding obesity. He is currently taking Phentermine 15 mg. He has no complaints of side effects with his medication. He continues with the keto diet and is enjoying this. For exercise he has been walking his dog often throughout the day and is going to the gym multiple times per week .   He is frustrated during his office visit today as he has not lost more weight   He continues to feel as though he has a lot of energy and continues to notice that his clothes are becoming more baggy.   Wt Readings from Last 3 Encounters:  11/21/16 (!) 402 lb (182.3 kg)  10/24/16 (!) 406 lb 3.2 oz (184.3 kg)  09/19/16 (!) 418 lb (189.6 kg)   Neck Circumfrance   09/19/2016 - 19 in 10/24/2016 - 18.75 in 11/21/2016 19.25 in  Review of Systems  Constitutional: Negative.   Respiratory: Negative.   Cardiovascular: Negative.   Gastrointestinal: Negative.   Genitourinary: Negative.   Musculoskeletal: Negative.   Neurological: Negative.   All other systems reviewed and are negative.  See HPI   Past Medical History:  Diagnosis Date  . Chicken pox   . DVT (deep venous thrombosis) ([place])   . GERD (gastroesophageal reflux disease)   . Hypertension   . Kidney stones   . Morbid obesity ([place])     Social History   Social History  . Marital status: Married    Spouse name: N/A  . Number of children: N/A  . Years of education: N/A   Occupational History  . Not on file.   Social History Main Topics  . Smoking status: Never Smoker  . Smokeless tobacco: Never Used  . Alcohol use No  . Drug use: No  . Sexual activity: Not on file   Other Topics  Concern  . Not on file   Social History Narrative   High School education        Past Surgical History:  Procedure Laterality Date  . KNEE ARTHROSCOPY W/ MENISCAL REPAIR     Left    Family History  Problem Relation Age of Onset  . Diabetes Father   . Hypertension Father   . Arthritis Father   . Heart disease Father   . Breast cancer Maternal Grandmother     Allergies  Allergen Reactions  . Amoxicillin Hives  . Morphine   . Requip [Ropinirole Hcl]     Current Outpatient Prescriptions on File Prior to Visit  Medication Sig Dispense Refill  . lisinopril-hydrochlorothiazide (PRINZIDE,ZESTORETIC) 20-12.5 MG tablet Take 1 tablet by mouth daily. 90 tablet 3  . omeprazole (PRILOSEC) 20 MG capsule Take 1 capsule (20 mg total) by mouth daily. 90 capsule 2   No current facility-administered medications on file prior to visit.     BP 130/70 (BP Location: Left Arm)   Temp 98 F (36.7 C) (Oral)   Wt (!) 402 lb (182.3 kg)   BMI 54.52 kg/m       Objective:   Physical Exam  Constitutional: He is oriented to person, place, and time. He appears well-developed and well-nourished. No distress.  Obese  Cardiovascular: Normal rate, regular rhythm, normal heart sounds and intact distal pulses.  Exam reveals no gallop.   No murmur heard. Pulmonary/Chest: Effort normal and breath sounds normal. No respiratory distress. He has no wheezes. He has no rales. He exhibits no tenderness.  Neurological: He is alert and oriented to person, place, and time.  Skin: Skin is warm and dry. No rash noted. He is not diaphoretic. No erythema. No pallor.  Psychiatric: He has a normal mood and affect. His behavior is normal. Judgment and thought content normal.  Nursing note and vitals reviewed.     Assessment & Plan:  1. MORBID OBESITY - Congratulated on weight loss. He has lost approx 45 pounds over the last three months, he should be proud of this. I am going to increase Phentermine from 15 mg  to 30 mg  - phentermine 30 MG capsule; Take 1 capsule (30 mg total) by mouth every morning.  Dispense: 30 capsule; Refill: 0 - Follow up in one month   [name], NP

## 2016-12-19 ENCOUNTER — Encounter: Payer: Self-pay | Admitting: Adult Health

## 2016-12-19 ENCOUNTER — Ambulatory Visit: Payer: 59 | Admitting: Adult Health

## 2016-12-19 DIAGNOSIS — I10 Essential (primary) hypertension: Secondary | ICD-10-CM | POA: Diagnosis not present

## 2016-12-19 MED ORDER — PHENTERMINE HCL 30 MG PO CAPS: 30.0000 mg | ORAL_CAPSULE | ORAL | 0 refills | Status: DC

## 2016-12-19 NOTE — Progress Notes (Signed)
Subjective:    Patient ID: Jordan Trujillo, male    DOB: 04-13-1980, 36 y.o.   MRN: [id number]  HPI  36 year old male who  has a past medical history of Chicken pox, DVT (deep venous thrombosis) ([place]), GERD (gastroesophageal reflux disease), Hypertension, Kidney stones, and Morbid obesity ([place]). He presents to the office today for follow up regarding obesity. During his last visit Phentermine was increased from 15 mg to 30 mg. He has no complaints of side effects with this medication. He continues with the keto diet and enjoys this. He continues to go to the gym multiple times per week and is walking his dog 2-3 times a day.   Today in the office he reports that he has changed up his diet slightly, he is snacking less often and drinking more water. He does not have any complaints since increasing Phentermine   His starting weight was 447 pounds   Wt Readings from Last 3 Encounters:  12/19/16 (!) 386 lb (175.1 kg)  11/21/16 (!) 402 lb (182.3 kg)  10/24/16 (!) 406 lb 3.2 oz (184.3 kg)    Neck Circumfrance   09/19/2016 - 19 in 10/24/2016 - 18.75 in 10/23/20180-  19.25 in 12/19/2016- 18.75  Review of Systems  Constitutional: Negative.   Respiratory: Negative.   Cardiovascular: Negative.   Genitourinary: Negative.   Neurological: Negative.   All other systems reviewed and are negative.  Past Medical History:  Diagnosis Date  . Chicken pox   . DVT (deep venous thrombosis) ([place])   . GERD (gastroesophageal reflux disease)   . Hypertension   . Kidney stones   . Morbid obesity ([place])     Social History   Socioeconomic History  . Marital status: Married    Spouse name: Not on file  . Number of children: Not on file  . Years of education: Not on file  . Highest education level: Not on file  Social Needs  . Financial resource strain: Not on file  . Food insecurity - worry: Not on file  . Food insecurity - inability: Not on file  . Transportation needs - medical: Not on  file  . Transportation needs - non-medical: Not on file  Occupational History  . Not on file  Tobacco Use  . Smoking status: Never Smoker  . Smokeless tobacco: Never Used  Substance and Sexual Activity  . Alcohol use: No  . Drug use: No  . Sexual activity: Not on file  Other Topics Concern  . Not on file  Social History Narrative   High School education     Past Surgical History:  Procedure Laterality Date  . KNEE ARTHROSCOPY W/ MENISCAL REPAIR     Left    Family History  Problem Relation Age of Onset  . Diabetes Father   . Hypertension Father   . Arthritis Father   . Heart disease Father   . Breast cancer Maternal Grandmother     Allergies  Allergen Reactions  . Amoxicillin Hives  . Morphine   . Requip [Ropinirole Hcl]     Current Outpatient Medications on File Prior to Visit  Medication Sig Dispense Refill  . lisinopril-hydrochlorothiazide (PRINZIDE,ZESTORETIC) 20-12.5 MG tablet Take 1 tablet by mouth daily. 90 tablet 3  . omeprazole (PRILOSEC) 20 MG capsule Take 1 capsule (20 mg total) by mouth daily. 90 capsule 2  . phentermine 30 MG capsule Take 1 capsule (30 mg total) by mouth every morning. 30 capsule 0   No current  facility-administered medications on file prior to visit.     BP 136/70 (BP Location: Left Arm)   Temp 98.2 F (36.8 C) (Oral)   Wt (!) 386 lb (175.1 kg)   BMI 52.35 kg/m       Objective:   Physical Exam  Constitutional: He is oriented to person, place, and time. He appears well-developed and well-nourished. No distress.  Morbidly obese   Cardiovascular: Normal rate, regular rhythm and intact distal pulses. Exam reveals no gallop and no friction rub.  No murmur heard. Pulmonary/Chest: Effort normal and breath sounds normal. No respiratory distress. He has no wheezes. He has no rales. He exhibits no tenderness.  Neurological: He is alert and oriented to person, place, and time.  Skin: Skin is warm and dry. No rash noted. He is not  diaphoretic. No erythema. No pallor.  Psychiatric: He has a normal mood and affect. His behavior is normal. Judgment and thought content normal.  Nursing note and vitals reviewed.     Assessment & Plan:  1. MORBID OBESITY - He has lost 16 pounds this month. Going forward, what I would like to do is prescribe Phentermine for November and December and then take a break until February to see how he does off the medication. He is ok with this plan  - Follow up in one month  - phentermine 30 MG capsule; Take 1 capsule (30 mg total) every morning by mouth.  Dispense: 30 capsule; Refill: 0  2. Essential hypertension, benign - Near goal. Will continue to monitor    [name], NP

## 2017-01-18 ENCOUNTER — Encounter: Payer: Self-pay | Admitting: Adult Health

## 2017-01-18 ENCOUNTER — Ambulatory Visit: Payer: 59 | Admitting: Adult Health

## 2017-01-18 LAB — BASIC METABOLIC PANEL
BUN: 23 mg/dL (ref 6–23)
CALCIUM: 8.9 mg/dL (ref 8.4–10.5)
CO2: 28 meq/L (ref 19–32)
Chloride: 102 mEq/L (ref 96–112)
Creatinine, Ser: 0.89 mg/dL (ref 0.40–1.50)
GFR: 101.66 mL/min (ref >60.00)
GLUCOSE: 84 mg/dL (ref 70–99)
Potassium: 3.9 mEq/L (ref 3.5–5.1)
SODIUM: 139 meq/L (ref 135–145)

## 2017-01-18 LAB — HEMOGLOBIN A1C: Hgb A1c MFr Bld: 5.5 % (ref 4.6–6.5)

## 2017-01-18 NOTE — Progress Notes (Signed)
Subjective:    Patient ID: Jordan Trujillo, male    DOB: 06-02-1980, 36 y.o.   MRN: [id number]  HPI  36 year old male who  has a past medical history of Chicken pox, DVT (deep venous thrombosis) ([place]), GERD (gastroesophageal reflux disease), Hypertension, Kidney stones, and Morbid obesity ([place]). He presents to the office today for follow up regarding weight loss.  He continues with the keto diet enjoying this.  Additionally he is going to the gym multiple times a week and walking the dog 2-3 times per day.  Medication regimen for obesity includes phentermine 30 mg.  He denies any side effects of this medication.  This is his fifth month on phentermine.  His last visit we spoke about prescribing phentermine until December and then taking a month or 2 off the medication and following up then.  He was okay with this plan this will be his last month of phentermine until the end of February.   His starting weight was 447 labs in July 2018  Wt Readings from Last 3 Encounters:  01/18/17 (!) 374 lb (169.6 kg)  12/19/16 (!) 386 lb (175.1 kg)  11/21/16 (!) 402 lb (182.3 kg)    Neck Circumference   09/19/2016 - 19 in 10/24/2016 - 18.75 in 10/23/20180-  19.25 in 12/19/2016- 18.75  His waist circumference was 56 inch's. This is the first time we have been able to measure this as I did not have a long enough tape measure. He reports that he is now buying 48 inch pants and has dropped to a 4XL shirt from a 6 XL.   He would like to come off Phentermine at this time and see how he does.   Review of Systems See HPI   Past Medical History:  Diagnosis Date  . Chicken pox   . DVT (deep venous thrombosis) ([place])   . GERD (gastroesophageal reflux disease)   . Hypertension   . Kidney stones   . Morbid obesity ([place])     Social History   Socioeconomic History  . Marital status: Married    Spouse name: Not on file  . Number of children: Not on file  . Years of education: Not on file  . Highest  education level: Not on file  Social Needs  . Financial resource strain: Not on file  . Food insecurity - worry: Not on file  . Food insecurity - inability: Not on file  . Transportation needs - medical: Not on file  . Transportation needs - non-medical: Not on file  Occupational History  . Not on file  Tobacco Use  . Smoking status: Never Smoker  . Smokeless tobacco: Never Used  Substance and Sexual Activity  . Alcohol use: No  . Drug use: No  . Sexual activity: Not on file  Other Topics Concern  . Not on file  Social History Narrative   High School education     Past Surgical History:  Procedure Laterality Date  . KNEE ARTHROSCOPY W/ MENISCAL REPAIR     Left    Family History  Problem Relation Age of Onset  . Diabetes Father   . Hypertension Father   . Arthritis Father   . Heart disease Father   . Breast cancer Maternal Grandmother     Allergies  Allergen Reactions  . Amoxicillin Hives  . Morphine   . Requip [Ropinirole Hcl]     Current Outpatient Medications on File Prior to Visit  Medication Sig Dispense Refill  .  lisinopril-hydrochlorothiazide (PRINZIDE,ZESTORETIC) 20-12.5 MG tablet Take 1 tablet by mouth daily. 90 tablet 3  . omeprazole (PRILOSEC) 20 MG capsule Take 1 capsule (20 mg total) by mouth daily. 90 capsule 2  . phentermine 30 MG capsule Take 1 capsule (30 mg total) every morning by mouth. 30 capsule 0   No current facility-administered medications on file prior to visit.     BP 130/80 (BP Location: Left Arm)   Temp 98 F (36.7 C) (Oral)   Wt (!) 374 lb (169.6 kg)   BMI 50.72 kg/m       Objective:   Physical Exam  Constitutional: He is oriented to person, place, and time. He appears well-developed and well-nourished. No distress.  Cardiovascular: Normal rate, regular rhythm, normal heart sounds and intact distal pulses. Exam reveals no gallop and no friction rub.  No murmur heard. Pulmonary/Chest: Effort normal and breath sounds  normal. No respiratory distress. He has no wheezes. He has no rales. He exhibits no tenderness.  Neurological: He is alert and oriented to person, place, and time.  Skin: Skin is warm and dry. No rash noted. He is not diaphoretic. No erythema. No pallor.  Psychiatric: He has a normal mood and affect. His behavior is normal. Judgment and thought content normal.  Nursing note and vitals reviewed.     Assessment & Plan:  1. MORBID OBESITY - Follow up in 2 months or sooner if needed - Basic Metabolic Panel - Hemoglobin A1c - Insulin, Fasting   [name], NP

## 2017-01-19 LAB — INSULIN, RANDOM: Insulin: 16.2 u[IU]/mL (ref 2.0–19.6)

## 2017-01-24 ENCOUNTER — Encounter: Payer: Self-pay | Admitting: Adult Health

## 2017-03-08 ENCOUNTER — Encounter: Payer: Self-pay | Admitting: Adult Health

## 2017-03-08 ENCOUNTER — Ambulatory Visit: Payer: 59 | Admitting: Adult Health

## 2017-03-08 DIAGNOSIS — G4730 Sleep apnea, unspecified: Secondary | ICD-10-CM

## 2017-03-08 MED ORDER — PHENTERMINE HCL 30 MG PO CAPS: 30.0000 mg | ORAL_CAPSULE | ORAL | 0 refills | Status: DC

## 2017-03-08 NOTE — Progress Notes (Signed)
Subjective:    Patient ID: Jordan Trujillo, male    DOB: 1980-03-22, 37 y.o.   MRN: [id number]  HPI  37 year old male who  has a past medical history of Chicken pox, DVT (deep venous thrombosis) ([place]), GERD (gastroesophageal reflux disease), Hypertension, Kidney stones, and Morbid obesity ([place]). He presents to the office today for follow up regarding obesity.  I last saw D[place]ik before the holidays at which point placed on a phentermine vacation for 2 months.  He has been on phentermine for for approximately 5 months at this point in time and had been able to lose a total of 74 pounds since this journey began.  His diet has consisted of keto diet he has been working out at [employer] on a regular basis as well as supplementing workouts with walking his dogs multiple times a day.  In the office he reports that since last I saw him he has continued the keto diet. He reports that he has been able to maintain his weight over the holidays. He also reports that he got a promotion at work, unfortunately this promotion has him working 70-80 hours a week and he has not been exercising as much as he would like but he continues to do some type of exercise on a daily basis   He would like to establish care with a pulmonologist here in [place]. He reports that his previous one had closed their office.    Neck Circumference   09/19/2016 - 19 in 10/24/2016 - 18.75 in 10/23/20180-19.25 in 12/19/2016-18.75 01/18/2017 - 18.75 03/08/2016 - 18.75   Waist Circumference  01/18/2017 - 56 inch's  03/08/2017 - 53.5 inch  Wt Readings from Last 3 Encounters:  03/08/17 (!) 373 lb (169.2 kg)  01/18/17 (!) 374 lb (169.6 kg)  12/19/16 (!) 386 lb (175.1 kg)    Review of Systems See HPI   Past Medical History:  Diagnosis Date  . Chicken pox   . DVT (deep venous thrombosis) ([place])   . GERD (gastroesophageal reflux disease)   . Hypertension   . Kidney stones   . Morbid obesity ([place])     Social History    Socioeconomic History  . Marital status: Married    Spouse name: Not on file  . Number of children: Not on file  . Years of education: Not on file  . Highest education level: Not on file  Social Needs  . Financial resource strain: Not on file  . Food insecurity - worry: Not on file  . Food insecurity - inability: Not on file  . Transportation needs - medical: Not on file  . Transportation needs - non-medical: Not on file  Occupational History  . Not on file  Tobacco Use  . Smoking status: Never Smoker  . Smokeless tobacco: Never Used  Substance and Sexual Activity  . Alcohol use: No  . Drug use: No  . Sexual activity: Not on file  Other Topics Concern  . Not on file  Social History Narrative   High School education     Past Surgical History:  Procedure Laterality Date  . KNEE ARTHROSCOPY W/ MENISCAL REPAIR     Left    Family History  Problem Relation Age of Onset  . Diabetes Father   . Hypertension Father   . Arthritis Father   . Heart disease Father   . Breast cancer Maternal Grandmother     Allergies  Allergen Reactions  . Amoxicillin Hives  . Morphine   .  Requip [Ropinirole Hcl]     Current Outpatient Medications on File Prior to Visit  Medication Sig Dispense Refill  . lisinopril-hydrochlorothiazide (PRINZIDE,ZESTORETIC) 20-12.5 MG tablet Take 1 tablet by mouth daily. 90 tablet 3  . omeprazole (PRILOSEC) 20 MG capsule Take 1 capsule (20 mg total) by mouth daily. 90 capsule 2  . phentermine 30 MG capsule Take 1 capsule (30 mg total) every morning by mouth. (Patient not taking: Reported on 03/08/2017) 30 capsule 0   No current facility-administered medications on file prior to visit.     BP 126/80 (BP Location: Left Arm)   Temp 97.9 F (36.6 C) (Oral)   Wt (!) 373 lb (169.2 kg)   BMI 50.59 kg/m       Objective:   Physical Exam  Constitutional: He is oriented to person, place, and time. He appears well-developed and well-nourished. No distress.   Morbidly obese   Cardiovascular: Normal rate, regular rhythm, normal heart sounds and intact distal pulses. Exam reveals no gallop and no friction rub.  No murmur heard. Pulmonary/Chest: Effort normal and breath sounds normal. No respiratory distress. He has no wheezes. He has no rales. He exhibits no tenderness.  Abdominal: Soft. Bowel sounds are normal. He exhibits no distension and no mass. There is no tenderness. There is no rebound and no guarding.  Musculoskeletal: Normal range of motion. He exhibits no edema, tenderness or deformity.  Neurological: He is alert and oriented to person, place, and time.  Skin: Skin is warm and dry. No rash noted. He is not diaphoretic. No erythema.  Psychiatric: He has a normal mood and affect. His behavior is normal. Judgment and thought content normal.  Nursing note and vitals reviewed.     Assessment & Plan:  1. MORBID OBESITY - Will restart on Phentermine.  - Follow up in one month or sooner if needed - phentermine 30 MG capsule; Take 1 capsule (30 mg total) by mouth every morning.  Dispense: 30 capsule; Refill: 0  2. Sleep apnea, unspecified type  - Ambulatory referral to Pulmonology   [name], NP

## 2017-04-06 ENCOUNTER — Other Ambulatory Visit: Payer: Self-pay | Admitting: Adult Health

## 2017-04-06 DIAGNOSIS — K2100 Gastro-esophageal reflux disease with esophagitis, without bleeding: Secondary | ICD-10-CM

## 2017-04-06 NOTE — Telephone Encounter (Signed)
Sent to the pharmacy by e-scribe. 

## 2017-04-11 ENCOUNTER — Encounter: Payer: Self-pay | Admitting: Adult Health

## 2017-04-11 ENCOUNTER — Ambulatory Visit: Payer: 59 | Admitting: Adult Health

## 2017-04-11 NOTE — Progress Notes (Signed)
Subjective:    Patient ID: Jordan Trujillo, male    DOB: 1980-10-30, 37 y.o.   MRN: [id number]  HPI  37 year old male who  has a past medical history of Chicken pox, DVT (deep venous thrombosis) ([place]), GERD (gastroesophageal reflux disease), Hypertension, Kidney stones, and Morbid obesity ([place]).   He presents to the office today for one-month follow-up and weight management.  In the last visit he was restarted on phentermine 30 mg daily after a 61-month hiatus over the holidays.  He was able to maintain his weight over the holidays and felt very proud of this.  They are discussing options during the last visit he opted to start back on phentermine for a short period of time.    Today in the office he reports that he continues to do the keto diet. When it comes to exercise he has had a hard month. He is working about 70-80 hours a week. He has not exercised at all this month.   He denies any side effects of phentermine such as insomnia or tachycardia.  Wt Readings from Last 3 Encounters:  04/11/17 (!) 373 lb (169.2 kg)  03/08/17 (!) 373 lb (169.2 kg)  01/18/17 (!) 374 lb (169.6 kg)    NeckCircumference  09/19/2016 - 19 in 10/24/2016 - 18.75 in 10/23/20180-19.25 in 12/19/2016-18.75 01/18/2017 - 18.75 03/08/2016 - 18.75            Waist Circumference  01/18/2017 - 56 inch 03/08/2017 - 53.5 inch   BMI Readings from Last 10 Encounters:  04/11/17 50.59 kg/m  03/08/17 50.59 kg/m  01/18/17 50.72 kg/m  12/19/16 52.35 kg/m  11/21/16 54.52 kg/m  10/24/16 55.09 kg/m  09/19/16 56.69 kg/m  08/22/16 60.62 kg/m  07/11/16 59.67 kg/m  05/06/15 58.86 kg/m    Review of Systems See HPI   Past Medical History:  Diagnosis Date  . Chicken pox   . DVT (deep venous thrombosis) ([place])   . GERD (gastroesophageal reflux disease)   . Hypertension   . Kidney stones   . Morbid obesity ([place])     Social History   Socioeconomic History  . Marital status: Married    Spouse  name: Not on file  . Number of children: Not on file  . Years of education: Not on file  . Highest education level: Not on file  Social Needs  . Financial resource strain: Not on file  . Food insecurity - worry: Not on file  . Food insecurity - inability: Not on file  . Transportation needs - medical: Not on file  . Transportation needs - non-medical: Not on file  Occupational History  . Not on file  Tobacco Use  . Smoking status: Never Smoker  . Smokeless tobacco: Never Used  Substance and Sexual Activity  . Alcohol use: No  . Drug use: No  . Sexual activity: Not on file  Other Topics Concern  . Not on file  Social History Narrative   High School education     Past Surgical History:  Procedure Laterality Date  . KNEE ARTHROSCOPY W/ MENISCAL REPAIR     Left    Family History  Problem Relation Age of Onset  . Diabetes Father   . Hypertension Father   . Arthritis Father   . Heart disease Father   . Breast cancer Maternal Grandmother     Allergies  Allergen Reactions  . Amoxicillin Hives  . Morphine   . Requip [Ropinirole Hcl]  Current Outpatient Medications on File Prior to Visit  Medication Sig Dispense Refill  . lisinopril-hydrochlorothiazide (PRINZIDE,ZESTORETIC) 20-12.5 MG tablet Take 1 tablet by mouth daily. 90 tablet 3  . omeprazole (PRILOSEC) 20 MG capsule TAKE 1 CAPSULE BY MOUTH  DAILY 90 capsule 0  . phentermine 30 MG capsule Take 1 capsule (30 mg total) by mouth every morning. 30 capsule 0   No current facility-administered medications on file prior to visit.     BP 132/68   Temp 98.3 F (36.8 C) (Oral)   Wt (!) 373 lb (169.2 kg)   BMI 50.59 kg/m       Objective:   Physical Exam  Constitutional: He is oriented to person, place, and time. He appears well-developed and well-nourished. No distress.  Obese  Appears tired    Cardiovascular: Normal rate, regular rhythm, normal heart sounds and intact distal pulses. Exam reveals no gallop and  no friction rub.  No murmur heard. Pulmonary/Chest: Effort normal and breath sounds normal. No respiratory distress. He has no wheezes. He has no rales. He exhibits no tenderness.  Musculoskeletal: Normal range of motion. He exhibits no edema, tenderness or deformity.  Neurological: He is alert and oriented to person, place, and time. He has normal reflexes. He displays normal reflexes. No cranial nerve deficit. He exhibits normal muscle tone. Coordination normal.  Skin: Skin is warm and dry. He is not diaphoretic.  Psychiatric: He has a normal mood and affect. His behavior is normal. Judgment and thought content normal.  Nursing note and vitals reviewed.     Assessment & Plan:  1. MORBID OBESITY - Will stop phentermine for the time being  - Advised to try and find some time to exercise, even if it is walking.  - Follow up in one month   [name], NP

## 2017-05-05 ENCOUNTER — Other Ambulatory Visit: Payer: Self-pay | Admitting: Adult Health

## 2017-05-05 DIAGNOSIS — K2100 Gastro-esophageal reflux disease with esophagitis, without bleeding: Secondary | ICD-10-CM

## 2017-05-08 ENCOUNTER — Encounter: Payer: Self-pay | Admitting: Adult Health

## 2017-05-08 NOTE — Telephone Encounter (Signed)
THIS MEDICATION WAS SENT ELECTRONICALLY ON 04/06/2017.  MESSAGE SENT TO THE PHARMACY TO CHECK FILE.

## 2017-05-16 ENCOUNTER — Ambulatory Visit: Payer: 59 | Admitting: Adult Health

## 2017-08-09 ENCOUNTER — Other Ambulatory Visit: Payer: Self-pay | Admitting: Adult Health

## 2017-08-09 DIAGNOSIS — K2100 Gastro-esophageal reflux disease with esophagitis, without bleeding: Secondary | ICD-10-CM

## 2017-08-09 DIAGNOSIS — I10 Essential (primary) hypertension: Secondary | ICD-10-CM

## 2017-08-10 NOTE — Telephone Encounter (Signed)
90 days is fine 

## 2017-08-10 NOTE — Telephone Encounter (Signed)
Sent to the pharmacy by e-scribe. 

## 2017-08-10 NOTE — Telephone Encounter (Signed)
Pt due for cpx on/after 08/23/17.  Would you like a 30 day supply or 90 day supply sent in?

## 2017-10-05 ENCOUNTER — Other Ambulatory Visit: Payer: Self-pay | Admitting: Adult Health

## 2017-10-05 DIAGNOSIS — I10 Essential (primary) hypertension: Secondary | ICD-10-CM

## 2017-10-05 DIAGNOSIS — K2100 Gastro-esophageal reflux disease with esophagitis, without bleeding: Secondary | ICD-10-CM

## 2017-10-05 NOTE — Telephone Encounter (Signed)
30 day supply sent to the pharmacy.  Pt is past due for cpx.

## 2018-02-13 ENCOUNTER — Ambulatory Visit (INDEPENDENT_AMBULATORY_CARE_PROVIDER_SITE_OTHER): Payer: 59 | Admitting: Adult Health

## 2018-02-13 ENCOUNTER — Encounter: Payer: Self-pay | Admitting: Adult Health

## 2018-02-13 VITALS — BP 112/80 | Temp 98.2°F | Ht 72.0 in | Wt >= 6400 oz

## 2018-02-13 DIAGNOSIS — Z0000 Encounter for general adult medical examination without abnormal findings: Secondary | ICD-10-CM | POA: Diagnosis not present

## 2018-02-13 DIAGNOSIS — K2100 Gastro-esophageal reflux disease with esophagitis, without bleeding: Secondary | ICD-10-CM

## 2018-02-13 DIAGNOSIS — I10 Essential (primary) hypertension: Secondary | ICD-10-CM | POA: Diagnosis not present

## 2018-02-13 LAB — CBC WITH DIFFERENTIAL/PLATELET
BASOS PCT: 0.7 % (ref 0.0–3.0)
Basophils Absolute: 0.1 10*3/uL (ref 0.0–0.1)
EOS ABS: 0.2 10*3/uL (ref 0.0–0.7)
Eosinophils Relative: 1.6 % (ref 0.0–5.0)
HCT: 43.7 % (ref 39.0–52.0)
Hemoglobin: 14.9 g/dL (ref 13.0–17.0)
Lymphocytes Relative: 32.2 % (ref 12.0–46.0)
Lymphs Abs: 3.1 10*3/uL (ref 0.7–4.0)
MCHC: 34.2 g/dL (ref 30.0–36.0)
MCV: 88.9 fl (ref 78.0–100.0)
MONO ABS: 0.9 10*3/uL (ref 0.1–1.0)
Monocytes Relative: 9 % (ref 3.0–12.0)
Neutro Abs: 5.4 10*3/uL (ref 1.4–7.7)
Neutrophils Relative %: 56.5 % (ref 43.0–77.0)
PLATELETS: 278 10*3/uL (ref 150.0–400.0)
RBC: 4.91 Mil/uL (ref 4.22–5.81)
RDW: 13.3 % (ref 11.5–15.5)
WBC: 9.5 10*3/uL (ref 4.0–10.5)

## 2018-02-13 LAB — LIPID PANEL
CHOLESTEROL: 160 mg/dL (ref 0–200)
HDL: 49 mg/dL (ref >39.00)
LDL CALC: 95 mg/dL (ref 0–99)
NonHDL: 110.7
Total CHOL/HDL Ratio: 3
Triglycerides: 78 mg/dL (ref 0.0–149.0)
VLDL: 15.6 mg/dL (ref 0.0–40.0)

## 2018-02-13 LAB — COMPREHENSIVE METABOLIC PANEL
ALT: 41 U/L (ref 0–53)
AST: 17 U/L (ref 0–37)
Albumin: 4.3 g/dL (ref 3.5–5.2)
Alkaline Phosphatase: 56 U/L (ref 39–117)
BUN: 19 mg/dL (ref 6–23)
CALCIUM: 9.4 mg/dL (ref 8.4–10.5)
CHLORIDE: 100 meq/L (ref 96–112)
CO2: 28 meq/L (ref 19–32)
CREATININE: 0.77 mg/dL (ref 0.40–1.50)
GFR: 119.48 mL/min (ref >60.00)
Glucose, Bld: 80 mg/dL (ref 70–99)
Potassium: 3.9 mEq/L (ref 3.5–5.1)
Sodium: 136 mEq/L (ref 135–145)
Total Bilirubin: 0.8 mg/dL (ref 0.2–1.2)
Total Protein: 6.7 g/dL (ref 6.0–8.3)

## 2018-02-13 LAB — TSH: TSH: 1.05 u[IU]/mL (ref 0.35–4.50)

## 2018-02-13 LAB — HEMOGLOBIN A1C: Hgb A1c MFr Bld: 5.6 % (ref 4.6–6.5)

## 2018-02-13 MED ORDER — LISINOPRIL-HYDROCHLOROTHIAZIDE 20-12.5 MG PO TABS: 1.0000 | ORAL_TABLET | Freq: Every day | ORAL | 3 refills | Status: DC

## 2018-02-13 MED ORDER — OMEPRAZOLE 20 MG PO CPDR: 20.0000 mg | DELAYED_RELEASE_CAPSULE | Freq: Every day | ORAL | 3 refills | Status: DC

## 2018-02-13 MED ORDER — PHENTERMINE HCL 15 MG PO CAPS: 15.0000 mg | ORAL_CAPSULE | ORAL | 0 refills | Status: DC

## 2018-02-13 NOTE — Progress Notes (Signed)
Subjective:    Patient ID: Jordan Trujillo, male    DOB: Mar 05, 1980, 38 y.o.   MRN: [id number]  HPI Patient presents for yearly preventative medicine examination. He is a pleasant 38 year old male who  has a past medical history of Chicken pox, DVT (deep venous thrombosis) ([place]), GERD (gastroesophageal reflux disease), Hypertension, Kidney stones, and Morbid obesity ([place]).  Essential Hypertension - Lisinopril- HCTZ BP Readings from Last 3 Encounters:  02/13/18 112/80  04/11/17 132/68  03/08/17 126/80   GERD - controlled with Prilosec   Morbid Obesity- Was last seen in March 2019 for this issue. At this time we had stopped Phentermine to see if he could continue to loose weight without the medication. He did not follow up after that visit. His weight is up almost 60 pounds since that time. He has cut back hours at work ( was working about 80 hours a week) and has started walking again. He has not started his diet. He would like to go back on phentermine to help get this weight off.   Wt Readings from Last 10 Encounters:  02/13/18 (!) 430 lb (195 kg)  04/11/17 (!) 373 lb (169.2 kg)  03/08/17 (!) 373 lb (169.2 kg)  01/18/17 (!) 374 lb (169.6 kg)  12/19/16 (!) 386 lb (175.1 kg)  11/21/16 (!) 402 lb (182.3 kg)  10/24/16 (!) 406 lb 3.2 oz (184.3 kg)  09/19/16 (!) 418 lb (189.6 kg)  08/22/16 (!) 447 lb (202.8 kg)  07/11/16 (!) 440 lb (199.6 kg)   All immunizations and health maintenance protocols were reviewed with the patient and needed orders were placed.  Appropriate screening laboratory values were ordered for the patient including screening of hyperlipidemia, renal function and hepatic function.  Medication reconciliation,  past medical history, social history, problem list and allergies were reviewed in detail with the patient  Goals were established with regard to weight loss, exercise, and  diet in compliance with medications. See above    Review of Systems    Constitutional: Negative.   HENT: Negative.   Eyes: Negative.   Respiratory: Negative.   Cardiovascular: Negative.   Gastrointestinal: Negative.   Endocrine: Negative.   Genitourinary: Negative.   Musculoskeletal: Negative.   Skin: Negative.   Allergic/Immunologic: Negative.   Neurological: Negative.   Hematological: Negative.   Psychiatric/Behavioral: Negative.   All other systems reviewed and are negative.  Past Medical History:  Diagnosis Date  . Chicken pox   . DVT (deep venous thrombosis) ([place])   . GERD (gastroesophageal reflux disease)   . Hypertension   . Kidney stones   . Morbid obesity ([place])     Social History   Socioeconomic History  . Marital status: Married    Spouse name: Not on file  . Number of children: Not on file  . Years of education: Not on file  . Highest education level: Not on file  Occupational History  . Not on file  Social Needs  . Financial resource strain: Not on file  . Food insecurity:    Worry: Not on file    Inability: Not on file  . Transportation needs:    Medical: Not on file    Non-medical: Not on file  Tobacco Use  . Smoking status: Never Smoker  . Smokeless tobacco: Never Used  Substance and Sexual Activity  . Alcohol use: No  . Drug use: No  . Sexual activity: Not on file  Lifestyle  . Physical activity:  Days per week: Not on file    Minutes per session: Not on file  . Stress: Not on file  Relationships  . Social connections:    Talks on phone: Not on file    Gets together: Not on file    Attends religious service: Not on file    Active member of club or organization: Not on file    Attends meetings of clubs or organizations: Not on file    Relationship status: Not on file  . Intimate partner violence:    Fear of current or ex partner: Not on file    Emotionally abused: Not on file    Physically abused: Not on file    Forced sexual activity: Not on file  Other Topics Concern  . Not on file  Social  History Narrative   High School education     Past Surgical History:  Procedure Laterality Date  . KNEE ARTHROSCOPY W/ MENISCAL REPAIR     Left    Family History  Problem Relation Age of Onset  . Diabetes Father   . Hypertension Father   . Arthritis Father   . Heart disease Father   . Breast cancer Maternal Grandmother     Allergies  Allergen Reactions  . Amoxicillin Hives  . Morphine   . Requip [Ropinirole Hcl]     No current outpatient medications on file prior to visit.   No current facility-administered medications on file prior to visit.     BP 112/80   Temp 98.2 F (36.8 C)   Ht 6' (1.829 m)   Wt (!) 430 lb (195 kg)   BMI 58.32 kg/m       Objective:   Physical Exam Vitals signs and nursing note reviewed.  Constitutional:      General: He is not in acute distress.    Appearance: Normal appearance. He is well-developed. He is obese. He is not diaphoretic.  HENT:     Head: Normocephalic and atraumatic.     Right Ear: Tympanic membrane, ear canal and external ear normal. There is no impacted cerumen.     Left Ear: Tympanic membrane, ear canal and external ear normal. There is no impacted cerumen.     Nose: Nose normal. No congestion or rhinorrhea.     Mouth/Throat:     Mouth: Mucous membranes are moist.     Pharynx: Oropharynx is clear. No oropharyngeal exudate.  Eyes:     General:        Right eye: No discharge.        Left eye: No discharge.     Extraocular Movements: Extraocular movements intact.     Conjunctiva/sclera: Conjunctivae normal.     Pupils: Pupils are equal, round, and reactive to light.  Neck:     Musculoskeletal: Normal range of motion and neck supple. No neck rigidity or muscular tenderness.     Thyroid: No thyromegaly.     Vascular: No carotid bruit.     Trachea: No tracheal deviation.  Cardiovascular:     Rate and Rhythm: Normal rate and regular rhythm.     Pulses: Normal pulses.     Heart sounds: Normal heart sounds. No  murmur. No friction rub. No gallop.   Pulmonary:     Effort: Pulmonary effort is normal. No respiratory distress.     Breath sounds: Normal breath sounds. No stridor. No wheezing, rhonchi or rales.  Chest:     Chest wall: No tenderness.  Abdominal:  General: Bowel sounds are normal. There is no distension.     Palpations: Abdomen is soft. There is no mass.     Tenderness: There is no abdominal tenderness. There is no right CVA tenderness, left CVA tenderness, guarding or rebound.     Hernia: No hernia is present.  Musculoskeletal: Normal range of motion.        General: No swelling, tenderness, deformity or signs of injury.     Right lower leg: No edema.     Left lower leg: No edema.  Lymphadenopathy:     Cervical: No cervical adenopathy.  Skin:    General: Skin is warm and dry.     Capillary Refill: Capillary refill takes less than 2 seconds.     Coloration: Skin is not jaundiced or pale.     Findings: No bruising, erythema, lesion or rash.  Neurological:     Mental Status: He is alert and oriented to person, place, and time.     Cranial Nerves: No cranial nerve deficit.     Coordination: Coordination normal.  Psychiatric:        Mood and Affect: Mood normal.        Behavior: Behavior normal.        Thought Content: Thought content normal.        Judgment: Judgment normal.       Assessment & Plan:  1. Routine general medical examination at a health care facility - One year follow up  - CBC with Differential/Platelet - Comprehensive metabolic panel - Hemoglobin A1c - Lipid panel - TSH  2. Essential hypertension, benign - Well controlled. No change in medication  - lisinopril-hydrochlorothiazide (PRINZIDE,ZESTORETIC) 20-12.5 MG tablet; Take 1 tablet by mouth daily.  Dispense: 90 tablet; Refill: 3 - CBC with Differential/Platelet - Comprehensive metabolic panel - Hemoglobin A1c - Lipid panel - TSH  3. Gastroesophageal reflux disease with esophagitis  -  omeprazole (PRILOSEC) 20 MG capsule; Take 1 capsule (20 mg total) by mouth daily. **NEEDS YEARLY PHYSICAL**  Dispense: 90 capsule; Refill: 3  4. MORBID OBESITY - I am ok with starting him back on Phentermine. He needs to get back into the gym and eating healthy. He also has to keep work hours to 40-50 a week in order to achieve his results.  - Follow up in one month  - phentermine 15 MG capsule; Take 1 capsule (15 mg total) by mouth every morning for 30 days.  Dispense: 30 capsule; Refill: 0 - CBC with Differential/Platelet - Comprehensive metabolic panel - Hemoglobin A1c - Lipid panel - TSH  [name], NP

## 2018-03-20 ENCOUNTER — Ambulatory Visit: Payer: 59 | Admitting: Adult Health

## 2018-03-20 ENCOUNTER — Encounter: Payer: Self-pay | Admitting: Adult Health

## 2018-03-20 MED ORDER — PHENTERMINE HCL 15 MG PO CAPS: 15.0000 mg | ORAL_CAPSULE | ORAL | 0 refills | Status: DC

## 2018-03-20 NOTE — Progress Notes (Signed)
Subjective:    Patient ID: Jordan Trujillo, male    DOB: 1980/06/03, 38 y.o.   MRN: [id number]  HPI  38 year old male who  has a past medical history of Chicken pox, DVT (deep venous thrombosis) ([place]), GERD (gastroesophageal reflux disease), Hypertension, Kidney stones, and Morbid obesity ([place]).  He presents to the office today for follow up regarding obesity and weight loss management. One month ago he was started back on Phentermine.   Wt Readings from Last 3 Encounters:  03/20/18 (!) 417 lb (189.1 kg)  02/13/18 (!) 430 lb (195 kg)  04/11/17 (!) 373 lb (169.2 kg)   He reports that he has been working on portion control. He has joined a gym and is planning on getting a Physiological scientist. He denies side effects such as insomnia, palpitations, or chest pain .  Review of Systems See HPI   Past Medical History:  Diagnosis Date  . Chicken pox   . DVT (deep venous thrombosis) ([place])   . GERD (gastroesophageal reflux disease)   . Hypertension   . Kidney stones   . Morbid obesity ([place])     Social History   Socioeconomic History  . Marital status: Married    Spouse name: Not on file  . Number of children: Not on file  . Years of education: Not on file  . Highest education level: Not on file  Occupational History  . Not on file  Social Needs  . Financial resource strain: Not on file  . Food insecurity:    Worry: Not on file    Inability: Not on file  . Transportation needs:    Medical: Not on file    Non-medical: Not on file  Tobacco Use  . Smoking status: Never Smoker  . Smokeless tobacco: Never Used  Substance and Sexual Activity  . Alcohol use: No  . Drug use: No  . Sexual activity: Not on file  Lifestyle  . Physical activity:    Days per week: Not on file    Minutes per session: Not on file  . Stress: Not on file  Relationships  . Social connections:    Talks on phone: Not on file    Gets together: Not on file    Attends religious service: Not on file   Active member of club or organization: Not on file    Attends meetings of clubs or organizations: Not on file    Relationship status: Not on file  . Intimate partner violence:    Fear of current or ex partner: Not on file    Emotionally abused: Not on file    Physically abused: Not on file    Forced sexual activity: Not on file  Other Topics Concern  . Not on file  Social History Narrative   High School education     Past Surgical History:  Procedure Laterality Date  . KNEE ARTHROSCOPY W/ MENISCAL REPAIR     Left    Family History  Problem Relation Age of Onset  . Diabetes Father   . Hypertension Father   . Arthritis Father   . Heart disease Father   . Breast cancer Maternal Grandmother     Allergies  Allergen Reactions  . Amoxicillin Hives  . Morphine   . Requip [Ropinirole Hcl]     Current Outpatient Medications on File Prior to Visit  Medication Sig Dispense Refill  . lisinopril-hydrochlorothiazide (PRINZIDE,ZESTORETIC) 20-12.5 MG tablet Take 1 tablet by mouth daily. 90 tablet  3  . omeprazole (PRILOSEC) 20 MG capsule Take 1 capsule (20 mg total) by mouth daily. **NEEDS YEARLY PHYSICAL** 90 capsule 3  . phentermine 15 MG capsule Take 1 capsule (15 mg total) by mouth every morning for 30 days. 30 capsule 0   No current facility-administered medications on file prior to visit.     BP (!) 142/76 Comment: NO MEDS  Temp 98.1 F (36.7 C)   Wt (!) 417 lb (189.1 kg)   BMI 56.56 kg/m       Objective:   Physical Exam Vitals signs and nursing note reviewed.  Constitutional:      Appearance: Normal appearance.  Cardiovascular:     Rate and Rhythm: Normal rate and regular rhythm.     Pulses: Normal pulses.     Heart sounds: Normal heart sounds.  Pulmonary:     Effort: Pulmonary effort is normal.     Breath sounds: Normal breath sounds.  Musculoskeletal: Normal range of motion.        General: No swelling or tenderness.  Skin:    General: Skin is warm and dry.   Neurological:     General: No focal deficit present.     Mental Status: He is alert and oriented to person, place, and time.  Psychiatric:        Mood and Affect: Mood normal.        Behavior: Behavior normal.        Thought Content: Thought content normal.        Judgment: Judgment normal.        Assessment & Plan:  1. MORBID OBESITY - 13 pound weight loss.  - Advised to continue to work on portion control and exercise at least three times a week for 30-45 minutes at a time.  - Follow up in one month  - phentermine 15 MG capsule; Take 1 capsule (15 mg total) by mouth every morning for 30 days.  Dispense: 30 capsule; Refill: 0  [name], NP

## 2018-04-17 ENCOUNTER — Ambulatory Visit: Payer: 59 | Admitting: Adult Health

## 2018-04-18 ENCOUNTER — Encounter: Payer: Self-pay | Admitting: Adult Health

## 2018-04-24 ENCOUNTER — Ambulatory Visit: Payer: 59 | Admitting: Adult Health

## 2018-07-16 ENCOUNTER — Ambulatory Visit: Payer: Self-pay

## 2018-07-16 NOTE — Telephone Encounter (Signed)
Patient's wife called and says the patient was exposed to her who was around a friend who just tested positive for Covid-19. She says her husband was not around the friend. She says her husband is not having any symptoms, but is high risk, so she's concerned about him. I advised Tommi Rumps will decide what to do for him. Arbie Cookey, [facility] will call the wife back as she was not on the phone when the call was to be connected.  Answer Assessment - Initial Assessment Questions 1. CLOSE CONTACT: "Who is the person with the confirmed or suspected COVID-19 infection that you were exposed to?"     Wife exposed to friend who tested positive 2. PLACE of CONTACT: "Where were you when you were exposed to COVID-19?" (e.g., home, school, medical waiting room; [place] 3. TYPE of CONTACT: "How much contact was there?" (e.g., sitting next to, live in same house, work in same office, same building)     Live in the same house 4. DURATION of CONTACT: "How long were you in contact with the COVID-19 patient?" (e.g., a few seconds, passed by person, a few minutes, live with the patient)     Live with wife 5. DATE of CONTACT: "When did you have contact with a COVID-19 patient?" (e.g., how many days ago)     Live with wife 6. TRAVEL: "Have you traveled out of the country recently?" If so, "When and where?"     * Also ask about out-of-state travel, since the CDC has identified some high-risk cities for community spread in the [place].     * Note: Travel becomes less relevant if there is widespread community transmission where the patient lives.     [place] a month ago, drove 7. COMMUNITY SPREAD: "Are there lots of cases of COVID-19 (community spread) where you live?" (See public health department website, if unsure)       No 8. SYMPTOMS: "Do you have any symptoms?" (e.g., fever, cough, breathing difficulty)     No 9. PREGNANCY OR POSTPARTUM: "Is there any chance you are pregnant?" "When was your last menstrual  period?" "Did you deliver in the last 2 weeks?"     N/A 10. HIGH RISK: "Do you have any heart or lung problems? Do you have a weak immune system?" (e.g., CHF, COPD, asthma, HIV positive, chemotherapy, renal failure, diabetes mellitus, sickle cell anemia)      HTN, morbid obesity  Protocols used: CORONAVIRUS (COVID-19) EXPOSURE-A-AH

## 2018-07-16 NOTE — Telephone Encounter (Signed)
Provider notified

## 2018-08-05 ENCOUNTER — Ambulatory Visit: Payer: Self-pay | Admitting: Adult Health

## 2018-08-05 ENCOUNTER — Other Ambulatory Visit: Payer: Self-pay

## 2018-08-05 ENCOUNTER — Ambulatory Visit (INDEPENDENT_AMBULATORY_CARE_PROVIDER_SITE_OTHER): Payer: 59 | Admitting: Family Medicine

## 2018-08-05 DIAGNOSIS — R05 Cough: Secondary | ICD-10-CM

## 2018-08-05 DIAGNOSIS — Z20828 Contact with and (suspected) exposure to other viral communicable diseases: Secondary | ICD-10-CM

## 2018-08-05 DIAGNOSIS — R059 Cough, unspecified: Secondary | ICD-10-CM

## 2018-08-05 MED ORDER — BENZONATATE 100 MG PO CAPS: ORAL_CAPSULE | ORAL | 1 refills | Status: DC

## 2018-08-05 NOTE — Progress Notes (Signed)
Patient ID: Jordan Trujillo, male   DOB: 11-Jun-1980, 38 y.o.   MRN: [id number]  This visit type was conducted due to national recommendations for restrictions regarding the COVID-19 pandemic in an effort to limit this patient's exposure and mitigate transmission in our community.   Virtual Visit via Video Note  I connected with Jordan Trujillo on 08/05/18 at  9:00 AM EDT by a video enabled telemedicine application and verified that I am speaking with the correct person using two identifiers.  Location patient: home Location provider:work or home office Persons participating in the virtual visit: patient, provider  I discussed the limitations of evaluation and management by telemedicine and the availability of in person appointments. The patient expressed understanding and agreed to proceed.   HPI: Patient seen with chief complaint of cough.  He states on Friday he developed headache and some sinus pressure and cough and nasal congestion.  He went to urgent care over the weekend and had COVID testing which is still pending.  Was not prescribed any medications.  His worst symptom is cough which is interfering with sleep.  He tried over-the-counter Delsym without improvement.  He denies any dyspnea at rest.  He has allergies to morphine.  No known sick contacts.  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  Mild body aches.  Clear to occasionally yellow-tinged nasal mucus.  No nausea, vomiting, or diarrhea.   ROS: See pertinent positives and negatives per HPI.  Past Medical History:  Diagnosis Date  . Chicken pox   . DVT (deep venous thrombosis) ([place])   . GERD (gastroesophageal reflux disease)   . Hypertension   . Kidney stones   . Morbid obesity ([place])     Past Surgical History:  Procedure Laterality Date  . KNEE ARTHROSCOPY W/ MENISCAL REPAIR     Left    Family History  Problem Relation Age of Onset  . Diabetes Father   . Hypertension  Father   . Arthritis Father   . Heart disease Father   . Breast cancer Maternal Grandmother     SOCIAL HX: Non-smoker   Current Outpatient Medications:  .  lisinopril-hydrochlorothiazide (PRINZIDE,ZESTORETIC) 20-12.5 MG tablet, Take 1 tablet by mouth daily., Disp: 90 tablet, Rfl: 3 .  omeprazole (PRILOSEC) 20 MG capsule, Take 1 capsule (20 mg total) by mouth daily. **NEEDS YEARLY PHYSICAL**, Disp: 90 capsule, Rfl: 3 .  phentermine 15 MG capsule, Take 1 capsule (15 mg total) by mouth every morning for 30 days., Disp: 30 capsule, Rfl: 0  EXAM:  VITALS per patient if applicable:  GENERAL: alert, oriented, appears well and in no acute distress  HEENT: atraumatic, conjunttiva clear, no obvious abnormalities on inspection of external nose and ears  NECK: normal movements of the head and neck  LUNGS: on inspection no signs of respiratory distress, breathing rate appears normal, no obvious gross SOB, gasping or wheezing  CV: no obvious cyanosis  MS: moves all visible extremities without noticeable abnormality  PSYCH/NEURO: pleasant and cooperative, no obvious depression or anxiety, speech and thought processing grossly intact  ASSESSMENT AND PLAN:  Discussed the following assessment and plan:  Cough-COVID-19 testing is pending.  He has not had any increased dyspnea at rest.  -Recommend trial of Tessalon Perles 100 mg 1-2 every 8 hours as needed for cough -Follow-up immediately for any increased shortness of breath or other concerns -Knows to stay quarantined until testing results are back  I discussed the assessment and treatment plan with the patient. The patient was provided an opportunity to ask questions and all were answered. The patient agreed with the plan and demonstrated an understanding of the instructions.   The patient was advised to call back or seek an in-person evaluation if the symptoms worsen or if the condition fails to improve as anticipated.   Carolann Littler, MD

## 2018-08-05 NOTE — Telephone Encounter (Signed)
Please schedule for virtual visit with any available provider.

## 2018-08-05 NOTE — Telephone Encounter (Signed)
Pt. Reports he started feeling bad Friday - fever, cough, sore throat, body aches. Was COVID 81 tested Sat. At [facility]. Pt. Has an appointment this morning.  Answer Assessment - Initial Assessment Questions 1. COVID-19 DIAGNOSIS: "Who made your Coronavirus (COVID-19) diagnosis?" "Was it confirmed by a positive lab test?" If not diagnosed by a HCP, ask "Are there lots of cases (community spread) where you live?" (See public health department website, if unsure)     Test done Sat. 2. ONSET: "When did the COVID-19 symptoms start?"      Started Friday - headache 3. WORST SYMPTOM: "What is your worst symptom?" (e.g., cough, fever, shortness of breath, muscle aches)     Cough 4. COUGH: "Do you have a cough?" If so, ask: "How bad is the cough?"       Yes - moderate 5. FEVER: "Do you have a fever?" If so, ask: "What is your temperature, how was it measured, and when did it start?"     Yes -  6. RESPIRATORY STATUS: "Describe your breathing?" (e.g., shortness of breath, wheezing, unable to speak)      No 7. BETTER-SAME-WORSE: "Are you getting better, staying the same or getting worse compared to yesterday?"  If getting worse, ask, "In what way?"     Worse 8. HIGH RISK DISEASE: "Do you have any chronic medical problems?" (e.g., asthma, heart or lung disease, weak immune system, etc.)     No 9. PREGNANCY: "Is there any chance you are pregnant?" "When was your last menstrual period?"     n/a 10. OTHER SYMPTOMS: "Do you have any other symptoms?"  (e.g., chills, fatigue, headache, loss of smell or taste, muscle pain, sore throat)       Yes  Protocols used: CORONAVIRUS (COVID-19) DIAGNOSED OR SUSPECTED-A-AH

## 2018-08-31 ENCOUNTER — Encounter: Payer: Self-pay | Admitting: Adult Health

## 2018-09-02 ENCOUNTER — Other Ambulatory Visit: Payer: Self-pay | Admitting: Adult Health

## 2018-09-03 ENCOUNTER — Telehealth (INDEPENDENT_AMBULATORY_CARE_PROVIDER_SITE_OTHER): Payer: 59 | Admitting: Adult Health

## 2018-09-03 ENCOUNTER — Other Ambulatory Visit: Payer: Self-pay

## 2018-09-03 DIAGNOSIS — J069 Acute upper respiratory infection, unspecified: Secondary | ICD-10-CM

## 2018-09-03 MED ORDER — METHYLPREDNISOLONE 4 MG PO TBPK: ORAL_TABLET | ORAL | 0 refills | Status: DC

## 2018-09-03 MED ORDER — DOXYCYCLINE HYCLATE 100 MG PO CAPS: 100.0000 mg | ORAL_CAPSULE | Freq: Two times a day (BID) | ORAL | 0 refills | Status: DC

## 2018-09-03 MED ORDER — PHENTERMINE HCL 15 MG PO CAPS: 15.0000 mg | ORAL_CAPSULE | ORAL | 0 refills | Status: DC

## 2018-09-03 NOTE — Progress Notes (Signed)
Virtual Visit via Video Note  I connected with Jordan Trujillo on 09/03/18 at  1:00 PM EDT by a video enabled telemedicine application and verified that I am speaking with the correct person using two identifiers.  Location patient: home Location provider:work or home office Persons participating in the virtual visit: patient, provider  I discussed the limitations of evaluation and management by telemedicine and the availability of in person appointments. The patient expressed understanding and agreed to proceed.   HPI:  38 year old male who is being evaluated today for an acute issue.  He reports that over the last month he has had a productive cough, fevers, body aches, shortness of breath, and wheezing.  He was tested for COVID at [facility] and tested negative.  Over the last few weeks fevers and body aches have resolved, he continues to have a semi-productive cough, shortness of breath, and wheezing that is worse in the morning and at night.  Additionally he reports left lower rib pain that is worse with coughing and movement.  Believes that this happened while he was having a coughing spell   Additionally, he would like to go back on phentermine.  He was last on phentermine in February but has been without it for the last 5 months.  Reports that his weight went up close to 450 lbs. This number causes him to become frustrated with his weight and he started eating healthier and exercising again. He is down to 432 lbs. He has done well on Phentermine in the past.    ROS: See pertinent positives and negatives per HPI.  Past Medical History:  Diagnosis Date  . Chicken pox   . DVT (deep venous thrombosis) ([place])   . GERD (gastroesophageal reflux disease)   . Hypertension   . Kidney stones   . Morbid obesity ([place])     Past Surgical History:  Procedure Laterality Date  . KNEE ARTHROSCOPY W/ MENISCAL REPAIR     Left    Family History  Problem Relation Age of Onset  . Diabetes Father   .  Hypertension Father   . Arthritis Father   . Heart disease Father   . Breast cancer Maternal Grandmother       Current Outpatient Medications:  .  benzonatate (TESSALON) 100 MG capsule, Take one to two capsules by mouth every 8 hours as needed for cough., Disp: 30 capsule, Rfl: 1 .  lisinopril-hydrochlorothiazide (PRINZIDE,ZESTORETIC) 20-12.5 MG tablet, Take 1 tablet by mouth daily., Disp: 90 tablet, Rfl: 3 .  omeprazole (PRILOSEC) 20 MG capsule, Take 1 capsule (20 mg total) by mouth daily. **NEEDS YEARLY PHYSICAL**, Disp: 90 capsule, Rfl: 3 .  phentermine 15 MG capsule, Take 1 capsule (15 mg total) by mouth every morning for 30 days., Disp: 30 capsule, Rfl: 0  EXAM:  VITALS per patient if applicable:  GENERAL: alert, oriented, appears well and in no acute distress  HEENT: atraumatic, conjunttiva clear, no obvious abnormalities on inspection of external nose and ears  NECK: normal movements of the head and neck  LUNGS: on inspection no signs of respiratory distress, breathing rate appears normal, no obvious gross SOB, gasping or wheezing  CV: no obvious cyanosis  MS: moves all visible extremities without noticeable abnormality  PSYCH/NEURO: pleasant and cooperative, no obvious depression or anxiety, speech and thought processing grossly intact  ASSESSMENT AND PLAN:  Discussed the following assessment and plan:  1. MORBID OBESITY - We discussed how this is becoming a trend for him. He takes phentermine  and does well with weight loss and then when he stops the medication his will power is gone and the weight comes back. He understands that this is going to be a lifelong battle for him. He needs to learn to control urges and not revert back to poor lifestyle choices.  - Follow up in one month  - phentermine 15 MG capsule; Take 1 capsule (15 mg total) by mouth every morning.  Dispense: 30 capsule; Refill: 0  2. Upper respiratory tract infection, unspecified type  - doxycycline  (VIBRAMYCIN) 100 MG capsule; Take 1 capsule (100 mg total) by mouth 2 (two) times daily.  Dispense: 14 capsule; Refill: 0 - methylPREDNISolone (MEDROL DOSEPAK) 4 MG TBPK tablet; Take as directed  Dispense: 21 tablet; Refill: 0 - Follow up if no improvement in the next 2-3 days    I discussed the assessment and treatment plan with the patient. The patient was provided an opportunity to ask questions and all were answered. The patient agreed with the plan and demonstrated an understanding of the instructions.   The patient was advised to call back or seek an in-person evaluation if the symptoms worsen or if the condition fails to improve as anticipated.   [name], NP

## 2018-09-03 NOTE — Telephone Encounter (Signed)
Pt scheduled for virtual office visit with [facility].  Nothing further needed.

## 2018-10-01 ENCOUNTER — Encounter: Payer: Self-pay | Admitting: Adult Health

## 2018-10-02 ENCOUNTER — Telehealth (INDEPENDENT_AMBULATORY_CARE_PROVIDER_SITE_OTHER): Payer: 59 | Admitting: Adult Health

## 2018-10-02 ENCOUNTER — Other Ambulatory Visit: Payer: Self-pay

## 2018-10-02 DIAGNOSIS — J069 Acute upper respiratory infection, unspecified: Secondary | ICD-10-CM

## 2018-10-02 MED ORDER — AZITHROMYCIN 250 MG PO TABS: ORAL_TABLET | ORAL | 0 refills | Status: DC

## 2018-10-02 MED ORDER — GUAIFENESIN-CODEINE 100-10 MG/5ML PO SYRP: 5.0000 mL | ORAL_SOLUTION | Freq: Three times a day (TID) | ORAL | 0 refills | Status: DC | PRN

## 2018-10-02 MED ORDER — PREDNISONE 10 MG PO TABS: ORAL_TABLET | ORAL | 0 refills | Status: DC

## 2018-10-02 NOTE — Progress Notes (Signed)
Virtual Visit via Video Note  I connected with Jordan Trujillo on 10/02/18 at  3:30 PM EDT by a video enabled telemedicine application and verified that I am speaking with the correct person using two identifiers.  Location patient: home Location provider:work or home office Persons participating in the virtual visit: patient, provider  I discussed the limitations of evaluation and management by telemedicine and the availability of in person appointments. The patient expressed understanding and agreed to proceed.   HPI:  38 year old male who is being evaluated today for an acute issue.  He was seen approximately 1 month ago for URI, at that time he had a productive cough, low-grade fever, body aches, shortness of breath and wheezing.  COVID test at [facility] urgent care tested negative.  He was placed on doxycycline and prednisone taper.  He reports today that his symptoms improved and almost resolved while he was taking this medication but over the last 2 weeks his symptoms come back and have worsened overall.  Today he reports productive cough, sinus pain and pressure, rhinorrhea, postnasal drip, low-grade fever, and shortness of breath.  He reports that cough is keeping him up at night and he is unable to sleep  ROS: See pertinent positives and negatives per HPI.  Past Medical History:  Diagnosis Date  . Chicken pox   . DVT (deep venous thrombosis) ([place])   . GERD (gastroesophageal reflux disease)   . Hypertension   . Kidney stones   . Morbid obesity ([place])     Past Surgical History:  Procedure Laterality Date  . KNEE ARTHROSCOPY W/ MENISCAL REPAIR     Left    Family History  Problem Relation Age of Onset  . Diabetes Father   . Hypertension Father   . Arthritis Father   . Heart disease Father   . Breast cancer Maternal Grandmother      Current Outpatient Medications:  .  benzonatate (TESSALON) 100 MG capsule, Take one to two capsules by mouth every 8 hours as needed for cough.,  Disp: 30 capsule, Rfl: 1 .  doxycycline (VIBRAMYCIN) 100 MG capsule, Take 1 capsule (100 mg total) by mouth 2 (two) times daily., Disp: 14 capsule, Rfl: 0 .  lisinopril-hydrochlorothiazide (PRINZIDE,ZESTORETIC) 20-12.5 MG tablet, Take 1 tablet by mouth daily., Disp: 90 tablet, Rfl: 3 .  methylPREDNISolone (MEDROL DOSEPAK) 4 MG TBPK tablet, Take as directed, Disp: 21 tablet, Rfl: 0 .  omeprazole (PRILOSEC) 20 MG capsule, Take 1 capsule (20 mg total) by mouth daily. **NEEDS YEARLY PHYSICAL**, Disp: 90 capsule, Rfl: 3 .  phentermine 15 MG capsule, Take 1 capsule (15 mg total) by mouth every morning., Disp: 30 capsule, Rfl: 0  EXAM:  VITALS per patient if applicable:  GENERAL: alert, oriented, appears well and in no acute distress  HEENT: atraumatic, conjunttiva clear, no obvious abnormalities on inspection of external nose and ears  NECK: normal movements of the head and neck  LUNGS: on inspection no signs of respiratory distress, breathing rate appears normal, no obvious gross SOB, gasping or wheezing  CV: no obvious cyanosis  MS: moves all visible extremities without noticeable abnormality  PSYCH/NEURO: pleasant and cooperative, no obvious depression or anxiety, speech and thought processing grossly intact  ASSESSMENT AND PLAN:  Discussed the following assessment and plan:  1. Upper respiratory tract infection, unspecified type -Possible antibiotic failure.  Will treat with another round of prednisone as well as a Z-Pak.  Wi - guaiFENesin-codeine (ROBITUSSIN AC) 100-10 MG/5ML syrup; Take 5 mLs by mouth  3 (three) times daily as needed for cough.  Dispense: 120 mL; Refill: 0 - predniSONE (DELTASONE) 10 MG tablet; 40 mg x 3 days, 20 mg x 3 days, 10 mg x 3 days  Dispense: 21 tablet; Refill: 0 - azithromycin (ZITHROMAX Z-PAK) 250 MG tablet; Take 2 tablets on Day 1.  Then take 1 tablet daily.  Dispense: 6 tablet; Refill: 0 - Follow up in 3-4 days if no improvement      I discussed the  assessment and treatment plan with the patient. The patient was provided an opportunity to ask questions and all were answered. The patient agreed with the plan and demonstrated an understanding of the instructions.   The patient was advised to call back or seek an in-person evaluation if the symptoms worsen or if the condition fails to improve as anticipated.   [name], NP

## 2018-10-03 ENCOUNTER — Encounter: Payer: Self-pay | Admitting: Adult Health

## 2018-11-11 ENCOUNTER — Encounter: Payer: Self-pay | Admitting: Adult Health

## 2018-11-12 ENCOUNTER — Ambulatory Visit: Payer: 59 | Admitting: Adult Health

## 2018-11-13 ENCOUNTER — Ambulatory Visit: Payer: 59 | Admitting: Adult Health

## 2018-11-13 ENCOUNTER — Other Ambulatory Visit: Payer: Self-pay

## 2018-11-13 ENCOUNTER — Encounter: Payer: Self-pay | Admitting: Adult Health

## 2018-11-13 VITALS — BP 150/100 | Temp 98.0°F | Wt >= 6400 oz

## 2018-11-13 DIAGNOSIS — L0291 Cutaneous abscess, unspecified: Secondary | ICD-10-CM

## 2018-11-13 MED ORDER — CLINDAMYCIN HCL 300 MG PO CAPS: 300.0000 mg | ORAL_CAPSULE | Freq: Three times a day (TID) | ORAL | 0 refills | Status: AC

## 2018-11-13 MED ORDER — PHENTERMINE HCL 15 MG PO CAPS: 15.0000 mg | ORAL_CAPSULE | ORAL | 0 refills | Status: DC

## 2018-11-13 NOTE — Progress Notes (Signed)
Subjective:    Patient ID: Jordan Trujillo, male    DOB: [birth year], 38 y.o.   MRN: [id number]  HPI 38 year old male who  has a past medical history of Chicken pox, DVT (deep venous thrombosis) ([place]), GERD (gastroesophageal reflux disease), Hypertension, Kidney stones, and Morbid obesity ([place]).   He presents to the office today for two issues.   1. Left sided head pain -symptoms started 3 days ago with a burning sensation on the left side of his head had radiated down behind his ear.  He noticed a small "bump" the left side of his head.  Denies any drainage.  He believes that he walked into spiderweb the other day and may have got bitten by a spider.  2. Obesity -was prescribed phentermine 15 mg back in August.  He reports that when he was taking the phentermine this time it caused him increased urination he had 2 bouts of urinary incontinence.  He stopped taking the medication and urinary urgency/frequency stopped.  He has never experienced this while being on phentermine.  Unfortunately his diet has suffered, he has not been exercising which is subsequently led to increased weight gain.  He reports that he has been doing a lot of stress eating and does not feel as though his spouse, who is also overweight gives him the support that he needs. he Would like to go back on phentermine   Wt Readings from Last 10 Encounters:  11/13/18 (!) 457 lb (207.3 kg)  03/20/18 (!) 417 lb (189.1 kg)  02/13/18 (!) 430 lb (195 kg)  04/11/17 (!) 373 lb (169.2 kg)  03/08/17 (!) 373 lb (169.2 kg)  01/18/17 (!) 374 lb (169.6 kg)  12/19/16 (!) 386 lb (175.1 kg)  11/21/16 (!) 402 lb (182.3 kg)  10/24/16 (!) 406 lb 3.2 oz (184.3 kg)  09/19/16 (!) 418 lb (189.6 kg)     Review of Systems See HPI   Past Medical History:  Diagnosis Date  . Chicken pox   . DVT (deep venous thrombosis) ([place])   . GERD (gastroesophageal reflux disease)   . Hypertension   . Kidney stones   . Morbid obesity ([place])      Social History   Socioeconomic History  . Marital status: Married    Spouse name: Not on file  . Number of children: Not on file  . Years of education: Not on file  . Highest education level: Not on file  Occupational History  . Not on file  Social Needs  . Financial resource strain: Not on file  . Food insecurity    Worry: Not on file    Inability: Not on file  . Transportation needs    Medical: Not on file    Non-medical: Not on file  Tobacco Use  . Smoking status: Never Smoker  . Smokeless tobacco: Never Used  Substance and Sexual Activity  . Alcohol use: No  . Drug use: No  . Sexual activity: Not on file  Lifestyle  . Physical activity    Days per week: Not on file    Minutes per session: Not on file  . Stress: Not on file  Relationships  . Social Herbalist on phone: Not on file    Gets together: Not on file    Attends religious service: Not on file    Active member of club or organization: Not on file    Attends meetings of clubs or organizations: Not on file  Relationship status: Not on file  . Intimate partner violence    Fear of current or ex partner: Not on file    Emotionally abused: Not on file    Physically abused: Not on file    Forced sexual activity: Not on file  Other Topics Concern  . Not on file  Social History Narrative   High School education     Past Surgical History:  Procedure Laterality Date  . KNEE ARTHROSCOPY W/ MENISCAL REPAIR     Left    Family History  Problem Relation Age of Onset  . Diabetes Father   . Hypertension Father   . Arthritis Father   . Heart disease Father   . Breast cancer Maternal Grandmother     Allergies  Allergen Reactions  . Amoxicillin Hives  . Morphine   . Requip [Ropinirole Hcl]     Current Outpatient Medications on File Prior to Visit  Medication Sig Dispense Refill  . lisinopril-hydrochlorothiazide (PRINZIDE,ZESTORETIC) 20-12.5 MG tablet Take 1 tablet by mouth daily. 90 tablet 3   . omeprazole (PRILOSEC) 20 MG capsule Take 1 capsule (20 mg total) by mouth daily. **NEEDS YEARLY PHYSICAL** 90 capsule 3   No current facility-administered medications on file prior to visit.     BP (!) 150/100   Temp 98 F (36.7 C)   Wt (!) 457 lb (207.3 kg)   BMI 61.98 kg/m       Objective:   Physical Exam Vitals signs and nursing note reviewed.  Constitutional:      Appearance: He is obese.  Neck:     Musculoskeletal: Normal range of motion. Muscular tenderness (Behind left ear) present.  Cardiovascular:     Rate and Rhythm: Normal rate and regular rhythm.     Pulses: Normal pulses.     Heart sounds: Normal heart sounds.  Pulmonary:     Effort: Pulmonary effort is normal.     Breath sounds: Normal breath sounds.  Lymphadenopathy:     Cervical: No cervical adenopathy.  Skin:    General: Skin is warm and dry.     Capillary Refill: Capillary refill takes less than 2 seconds.     Findings: Abscess present.     Comments: Quarter sized nonfluctuant abscess noted on left side of scalp.  No active draining  Neurological:     General: No focal deficit present.     Mental Status: He is alert and oriented to person, place, and time.  Psychiatric:        Mood and Affect: Mood normal.        Behavior: Behavior normal.        Thought Content: Thought content normal.        Judgment: Judgment normal.       Assessment & Plan:  1. MORBID OBESITY -His weight is the highest it has been in over a year and a half.  I think seeing a counselor for eating disorders would be helpful for him.  We will restart on phentermine, needs to change lifestyle modifications drastically. Follow up in one month  - phentermine 15 MG capsule; Take 1 capsule (15 mg total) by mouth every morning.  Dispense: 30 capsule; Refill: 0  2. Abscess - clindamycin (CLEOCIN) 300 MG capsule; Take 1 capsule (300 mg total) by mouth 3 (three) times daily for 10 days.  Dispense: 30 capsule; Refill: 0  [name], NP

## 2018-11-29 ENCOUNTER — Other Ambulatory Visit: Payer: Self-pay | Admitting: Adult Health

## 2018-11-29 DIAGNOSIS — K2100 Gastro-esophageal reflux disease with esophagitis, without bleeding: Secondary | ICD-10-CM

## 2018-11-29 DIAGNOSIS — I10 Essential (primary) hypertension: Secondary | ICD-10-CM

## 2018-12-03 NOTE — Telephone Encounter (Signed)
Denied.  Filled on 02/13/2018 for 1 year.  Request is too early.

## 2018-12-15 ENCOUNTER — Other Ambulatory Visit: Payer: Self-pay | Admitting: Adult Health

## 2018-12-15 DIAGNOSIS — I10 Essential (primary) hypertension: Secondary | ICD-10-CM

## 2018-12-15 DIAGNOSIS — K2100 Gastro-esophageal reflux disease with esophagitis, without bleeding: Secondary | ICD-10-CM

## 2018-12-18 ENCOUNTER — Ambulatory Visit: Payer: 59 | Admitting: Adult Health

## 2018-12-18 NOTE — Telephone Encounter (Signed)
DENIED.  SENT TO THE PHARMACY ON 02/13/2018 FOR 1 YEAR.  REQUEST IS TOO EARLY.

## 2018-12-24 ENCOUNTER — Ambulatory Visit: Payer: 59 | Admitting: Adult Health

## 2019-01-21 ENCOUNTER — Telehealth: Payer: Self-pay | Admitting: Family Medicine

## 2019-01-21 ENCOUNTER — Other Ambulatory Visit: Payer: Self-pay | Admitting: Adult Health

## 2019-01-21 MED ORDER — PHENTERMINE HCL 15 MG PO CAPS: 15.0000 mg | ORAL_CAPSULE | ORAL | 0 refills | Status: DC

## 2019-01-21 NOTE — Telephone Encounter (Signed)
Sent to pharmacy for 30 days

## 2019-01-21 NOTE — Telephone Encounter (Signed)
Pt notified that rx has been sent to the pharmacy.  Nothing further needed. 

## 2019-01-21 NOTE — Telephone Encounter (Signed)
Pt would like to know if he can have a 30 day supply of phentermine until he can come in.  Has not been able to make an appointment due to lack of employees at work.     [facility] on [address].

## 2019-03-12 ENCOUNTER — Ambulatory Visit: Payer: 59 | Admitting: Adult Health

## 2020-02-27 ENCOUNTER — Telehealth (INDEPENDENT_AMBULATORY_CARE_PROVIDER_SITE_OTHER): Payer: BC Managed Care – PPO | Admitting: Adult Health

## 2020-02-27 ENCOUNTER — Encounter: Payer: Self-pay | Admitting: Adult Health

## 2020-02-27 VITALS — Wt >= 6400 oz

## 2020-02-27 DIAGNOSIS — I10 Essential (primary) hypertension: Secondary | ICD-10-CM | POA: Diagnosis not present

## 2020-02-27 DIAGNOSIS — N3001 Acute cystitis with hematuria: Secondary | ICD-10-CM | POA: Diagnosis not present

## 2020-02-27 DIAGNOSIS — K2100 Gastro-esophageal reflux disease with esophagitis, without bleeding: Secondary | ICD-10-CM

## 2020-02-27 LAB — POCT URINALYSIS DIPSTICK
Bilirubin, UA: NEGATIVE
Glucose, UA: NEGATIVE
Ketones, UA: NEGATIVE
Nitrite, UA: POSITIVE
Protein, UA: POSITIVE — AB
Spec Grav, UA: 1.025 (ref 1.010–1.025)
Urobilinogen, UA: 0.2 E.U./dL
pH, UA: 6.5 (ref 5.0–8.0)

## 2020-02-27 MED ORDER — CIPROFLOXACIN HCL 500 MG PO TABS: 500.0000 mg | ORAL_TABLET | Freq: Two times a day (BID) | ORAL | 0 refills | Status: AC

## 2020-02-27 MED ORDER — OMEPRAZOLE 20 MG PO CPDR: 20.0000 mg | DELAYED_RELEASE_CAPSULE | Freq: Every day | ORAL | 0 refills | Status: DC

## 2020-02-27 MED ORDER — LISINOPRIL-HYDROCHLOROTHIAZIDE 20-12.5 MG PO TABS: 1.0000 | ORAL_TABLET | Freq: Every day | ORAL | 0 refills | Status: DC

## 2020-02-27 NOTE — Progress Notes (Signed)
Virtual Visit via Video Note  I connected with Standley Bargo  on 02/27/20 at 11:00 AM EST by a video enabled telemedicine application and verified that I am speaking with the correct person using two identifiers.  Location patient: home Location provider:work or home office Persons participating in the virtual visit: patient, provider  I discussed the limitations of evaluation and management by telemedicine and the availability of in person appointments. The patient expressed understanding and agreed to proceed.   HPI: 40 year old male who  has a past medical history of Chicken pox, DVT (deep venous thrombosis) ([place]), GERD (gastroesophageal reflux disease), Hypertension, Kidney stones, and Morbid obesity ([place]).  He is being evaluated today for UTI-like symptoms.  His symptoms started 5 days ago.  Symptoms include urinary frequency, urgency, dysuria, hematuria.  He had a day of fevers and chills in the middle of the week but none since.  He did drop off a urine sample today and the urinalysis showed positive for leukocytes, nitrites, protein, and . this was sent for culture.  Also needs a refill of his blood pressure medication as well as a GERD medication to get him until his physical next month   ROS: See pertinent positives and negatives per HPI.  Past Medical History:  Diagnosis Date  . Chicken pox   . DVT (deep venous thrombosis) ([place])   . GERD (gastroesophageal reflux disease)   . Hypertension   . Kidney stones   . Morbid obesity ([place])     Past Surgical History:  Procedure Laterality Date  . KNEE ARTHROSCOPY W/ MENISCAL REPAIR     Left    Family History  Problem Relation Age of Onset  . Diabetes Father   . Hypertension Father   . Arthritis Father   . Heart disease Father   . Breast cancer Maternal Grandmother        Current Outpatient Medications:  .  lisinopril-hydrochlorothiazide (PRINZIDE,ZESTORETIC) 20-12.5 MG tablet, Take 1 tablet by mouth daily., Disp: 90  tablet, Rfl: 3 .  omeprazole (PRILOSEC) 20 MG capsule, Take 1 capsule (20 mg total) by mouth daily. **NEEDS YEARLY PHYSICAL**, Disp: 90 capsule, Rfl: 3  EXAM:  VITALS per patient if applicable:  GENERAL: alert, oriented, appears well and in no acute distress  HEENT: atraumatic, conjunttiva clear, no obvious abnormalities on inspection of external nose and ears  NECK: normal movements of the head and neck  LUNGS: on inspection no signs of respiratory distress, breathing rate appears normal, no obvious gross SOB, gasping or wheezing  CV: no obvious cyanosis  MS: moves all visible extremities without noticeable abnormality  PSYCH/NEURO: pleasant and cooperative, no obvious depression or anxiety, speech and thought processing grossly intact  ASSESSMENT AND PLAN:  Discussed the following assessment and plan:  1. Acute cystitis with hematuria  - POCT urinalysis dipstick - Urine Culture; Future - Urine Culture - ciprofloxacin (CIPRO) 500 MG tablet; Take 1 tablet (500 mg total) by mouth 2 (two) times daily for 7 days.  Dispense: 14 tablet; Refill: 0  2. Essential hypertension, benign  - lisinopril-hydrochlorothiazide (ZESTORETIC) 20-12.5 MG tablet; Take 1 tablet by mouth daily.  Dispense: 30 tablet; Refill: 0  3. Gastroesophageal reflux disease with esophagitis  - omeprazole (PRILOSEC) 20 MG capsule; Take 1 capsule (20 mg total) by mouth daily. **NEEDS YEARLY PHYSICAL**  Dispense: 30 capsule; Refill: 0     I discussed the assessment and treatment plan with the patient. The patient was provided an opportunity to ask questions and all  were answered. The patient agreed with the plan and demonstrated an understanding of the instructions.   The patient was advised to call back or seek an in-person evaluation if the symptoms worsen or if the condition fails to improve as anticipated.   [name], NP

## 2020-02-29 LAB — URINE CULTURE
MICRO NUMBER:: 11469989
SPECIMEN QUALITY:: ADEQUATE

## 2020-03-16 ENCOUNTER — Ambulatory Visit: Payer: 59 | Admitting: Adult Health

## 2020-03-23 ENCOUNTER — Other Ambulatory Visit: Payer: Self-pay

## 2020-03-24 ENCOUNTER — Encounter: Payer: Self-pay | Admitting: Adult Health

## 2020-03-24 ENCOUNTER — Ambulatory Visit (INDEPENDENT_AMBULATORY_CARE_PROVIDER_SITE_OTHER): Payer: BC Managed Care – PPO | Admitting: Adult Health

## 2020-03-24 ENCOUNTER — Other Ambulatory Visit: Payer: Self-pay | Admitting: Adult Health

## 2020-03-24 VITALS — BP 140/92 | Temp 98.0°F | Ht 72.0 in | Wt >= 6400 oz

## 2020-03-24 DIAGNOSIS — Z0000 Encounter for general adult medical examination without abnormal findings: Secondary | ICD-10-CM | POA: Diagnosis not present

## 2020-03-24 DIAGNOSIS — I10 Essential (primary) hypertension: Secondary | ICD-10-CM | POA: Diagnosis not present

## 2020-03-24 DIAGNOSIS — K2100 Gastro-esophageal reflux disease with esophagitis, without bleeding: Secondary | ICD-10-CM

## 2020-03-24 LAB — LIPID PANEL
Cholesterol: 163 mg/dL (ref 0–200)
HDL: 47.6 mg/dL (ref >39.00)
LDL Cholesterol: 102 mg/dL — ABNORMAL HIGH (ref 0–99)
NonHDL: 115.54
Total CHOL/HDL Ratio: 3
Triglycerides: 66 mg/dL (ref 0.0–149.0)
VLDL: 13.2 mg/dL (ref 0.0–40.0)

## 2020-03-24 LAB — CBC WITH DIFFERENTIAL/PLATELET
Basophils Absolute: 0.1 10*3/uL (ref 0.0–0.1)
Basophils Relative: 0.9 % (ref 0.0–3.0)
Eosinophils Absolute: 0.2 10*3/uL (ref 0.0–0.7)
Eosinophils Relative: 2.4 % (ref 0.0–5.0)
HCT: 41.8 % (ref 39.0–52.0)
Hemoglobin: 14.1 g/dL (ref 13.0–17.0)
Lymphocytes Relative: 31.9 % (ref 12.0–46.0)
Lymphs Abs: 2.6 10*3/uL (ref 0.7–4.0)
MCHC: 33.7 g/dL (ref 30.0–36.0)
MCV: 88.9 fl (ref 78.0–100.0)
Monocytes Absolute: 0.8 10*3/uL (ref 0.1–1.0)
Monocytes Relative: 9.6 % (ref 3.0–12.0)
Neutro Abs: 4.5 10*3/uL (ref 1.4–7.7)
Neutrophils Relative %: 55.2 % (ref 43.0–77.0)
Platelets: 251 10*3/uL (ref 150.0–400.0)
RBC: 4.71 Mil/uL (ref 4.22–5.81)
RDW: 13.3 % (ref 11.5–15.5)
WBC: 8.2 10*3/uL (ref 4.0–10.5)

## 2020-03-24 LAB — TSH: TSH: 1.41 u[IU]/mL (ref 0.35–4.50)

## 2020-03-24 LAB — COMPREHENSIVE METABOLIC PANEL
ALT: 45 U/L (ref 0–53)
AST: 19 U/L (ref 0–37)
Albumin: 4 g/dL (ref 3.5–5.2)
Alkaline Phosphatase: 69 U/L (ref 39–117)
BUN: 17 mg/dL (ref 6–23)
CO2: 29 mEq/L (ref 19–32)
Calcium: 8.9 mg/dL (ref 8.4–10.5)
Chloride: 105 mEq/L (ref 96–112)
Creatinine, Ser: 0.8 mg/dL (ref 0.40–1.50)
GFR: 110.17 mL/min (ref >60.00)
Glucose, Bld: 104 mg/dL — ABNORMAL HIGH (ref 70–99)
Potassium: 4.4 mEq/L (ref 3.5–5.1)
Sodium: 140 mEq/L (ref 135–145)
Total Bilirubin: 0.5 mg/dL (ref 0.2–1.2)
Total Protein: 6.6 g/dL (ref 6.0–8.3)

## 2020-03-24 LAB — HEMOGLOBIN A1C: Hgb A1c MFr Bld: 6.3 % (ref 4.6–6.5)

## 2020-03-24 MED ORDER — OMEPRAZOLE 20 MG PO CPDR
20.0000 mg | DELAYED_RELEASE_CAPSULE | Freq: Every day | ORAL | 3 refills | Status: DC
Start: 2020-03-24 — End: 2020-09-22

## 2020-03-24 MED ORDER — LISINOPRIL-HYDROCHLOROTHIAZIDE 20-12.5 MG PO TABS: 1.0000 | ORAL_TABLET | Freq: Every day | ORAL | 3 refills | Status: DC

## 2020-03-24 MED ORDER — PHENTERMINE HCL 15 MG PO CAPS: 15.0000 mg | ORAL_CAPSULE | ORAL | 0 refills | Status: DC

## 2020-03-24 NOTE — Patient Instructions (Signed)
We will try another month of phentermine  Please follow up in one month   We will follow up with you regarding your blood work

## 2020-03-24 NOTE — Progress Notes (Signed)
Subjective:    Patient ID: Jordan Trujillo, male    DOB: 06/12/1980, 40 y.o.   MRN: [id number]  HPI Patient presents for yearly preventative medicine examination. He is a pleasant 40 year old male who  has a past medical history of Chicken pox, DVT (deep venous thrombosis) ([place]), GERD (gastroesophageal reflux disease), Hypertension, Kidney stones, and Morbid obesity ([place]).  Essential Hypertension -takes lisinopril/hydrochlorothiazide 20-12.5 mg.  He denies dizziness, lightheadedness, chest pain, or shortness of breath. He did not take his BP medication this morning.  BP Readings from Last 3 Encounters:  03/24/20 (!) 140/92  11/13/18 (!) 150/100  03/20/18 (!) 142/76   GERD-is controlled with Prilosec  Morbid obesity-we have trialed him on phentermine in the past and he has been able to lose significant weight, unfortunately as time goes on he stops coming to follow-up appointments no weight goes back on.  Does report that he is working long hours and is under a lot of stress due to work.  He has not been exercising or eating healthy. Unfortunately he has been laid up since October 2021 d/t acute medial meniscus tear that has been gping  through [employer] comp who seems to be dragging his heals.    Wt Readings from Last 10 Encounters:  03/24/20 (!) 482 lb (218.6 kg)  02/27/20 (!) 460 lb (208.7 kg)  11/13/18 (!) 457 lb (207.3 kg)  03/20/18 (!) 417 lb (189.1 kg)  02/13/18 (!) 430 lb (195 kg)  04/11/17 (!) 373 lb (169.2 kg)  03/08/17 (!) 373 lb (169.2 kg)  01/18/17 (!) 374 lb (169.6 kg)  12/19/16 (!) 386 lb (175.1 kg)  11/21/16 (!) 402 lb (182.3 kg)   All immunizations and health maintenance protocols were reviewed with the patient and needed orders were placed.  Appropriate screening laboratory values were ordered for the patient including screening of hyperlipidemia, renal function and hepatic function. If indicated by BPH, a PSA was ordered.  Medication reconciliation,  past  medical history, social history, problem list and allergies were reviewed in detail with the patient  Goals were established with regard to weight loss, exercise, and  diet in compliance with medications. He has not been able to exercise due to right knee injury and spends most of his time at work sitting in a chair for 10-12 hours a day. He is eating a lot more fast food.   Review of Systems  Constitutional: Negative.   HENT: Negative.   Eyes: Negative.   Respiratory: Negative.   Cardiovascular: Negative.   Gastrointestinal: Negative.   Endocrine: Negative.   Genitourinary: Negative.   Musculoskeletal: Positive for arthralgias and gait problem.  Skin: Negative.   Allergic/Immunologic: Negative.   Hematological: Negative.   Psychiatric/Behavioral: Negative.   All other systems reviewed and are negative.  Past Medical History:  Diagnosis Date  . Chicken pox   . DVT (deep venous thrombosis) ([place])   . GERD (gastroesophageal reflux disease)   . Hypertension   . Kidney stones   . Morbid obesity ([place])     Social History   Socioeconomic History  . Marital status: Married    Spouse name: Not on file  . Number of children: Not on file  . Years of education: Not on file  . Highest education level: Not on file  Occupational History  . Not on file  Tobacco Use  . Smoking status: Never Smoker  . Smokeless tobacco: Never Used  Substance and Sexual Activity  . Alcohol use: No  .  Drug use: No  . Sexual activity: Not on file  Other Topics Concern  . Not on file  Social History Narrative   High School education    Social Determinants of Health   Financial Resource Strain: Not on file  Food Insecurity: Not on file  Transportation Needs: Not on file  Physical Activity: Not on file  Stress: Not on file  Social Connections: Not on file  Intimate Partner Violence: Not on file    Past Surgical History:  Procedure Laterality Date  . KNEE ARTHROSCOPY W/ MENISCAL REPAIR      Left    Family History  Problem Relation Age of Onset  . Diabetes Father   . Hypertension Father   . Arthritis Father   . Heart disease Father   . Breast cancer Maternal Grandmother     Allergies  Allergen Reactions  . Amoxicillin Hives  . Morphine   . Requip [Ropinirole Hcl]     Current Outpatient Medications on File Prior to Visit  Medication Sig Dispense Refill  . lisinopril-hydrochlorothiazide (ZESTORETIC) 20-12.5 MG tablet Take 1 tablet by mouth daily. 30 tablet 0  . omeprazole (PRILOSEC) 20 MG capsule Take 1 capsule (20 mg total) by mouth daily. **NEEDS YEARLY PHYSICAL** 30 capsule 0   No current facility-administered medications on file prior to visit.    BP (!) 140/92   Temp 98 F (36.7 C)   Ht 6' (1.829 m) Comment: WITH SHOES  Wt (!) 482 lb (218.6 kg)   BMI 65.37 kg/m       Objective:   Physical Exam Vitals and nursing note reviewed.  Constitutional:      General: He is not in acute distress.    Appearance: Normal appearance. He is well-developed. He is obese.  HENT:     Head: Normocephalic and atraumatic.     Right Ear: Tympanic membrane, ear canal and external ear normal. There is no impacted cerumen.     Left Ear: Tympanic membrane, ear canal and external ear normal. There is no impacted cerumen.     Nose: Nose normal. No congestion or rhinorrhea.     Mouth/Throat:     Mouth: Mucous membranes are moist.     Pharynx: Oropharynx is clear. No oropharyngeal exudate or posterior oropharyngeal erythema.  Eyes:     General:        Right eye: No discharge.        Left eye: No discharge.     Extraocular Movements: Extraocular movements intact.     Conjunctiva/sclera: Conjunctivae normal.     Pupils: Pupils are equal, round, and reactive to light.  Neck:     Vascular: No carotid bruit.     Trachea: No tracheal deviation.  Cardiovascular:     Rate and Rhythm: Normal rate and regular rhythm.     Pulses: Normal pulses.     Heart sounds: Normal heart  sounds. No murmur heard. No friction rub. No gallop.   Pulmonary:     Effort: Pulmonary effort is normal. No respiratory distress.     Breath sounds: Normal breath sounds. No stridor. No wheezing, rhonchi or rales.  Chest:     Chest wall: No tenderness.  Abdominal:     General: Bowel sounds are normal. There is no distension.     Palpations: Abdomen is soft. There is no mass.     Tenderness: There is no abdominal tenderness. There is no right CVA tenderness, left CVA tenderness, guarding or rebound.  Hernia: No hernia is present.  Musculoskeletal:        General: No swelling, tenderness, deformity or signs of injury. Normal range of motion.     Right lower leg: No edema.     Left lower leg: No edema.  Lymphadenopathy:     Cervical: No cervical adenopathy.  Skin:    General: Skin is warm and dry.     Capillary Refill: Capillary refill takes less than 2 seconds.     Coloration: Skin is not jaundiced or pale.     Findings: No bruising, erythema, lesion or rash.  Neurological:     General: No focal deficit present.     Mental Status: He is alert and oriented to person, place, and time.     Cranial Nerves: No cranial nerve deficit.     Sensory: No sensory deficit.     Motor: No weakness.     Coordination: Coordination normal.     Gait: Gait abnormal (walks with a single prong cane).     Deep Tendon Reflexes: Reflexes normal.  Psychiatric:        Mood and Affect: Mood normal.        Behavior: Behavior normal.        Thought Content: Thought content normal.        Judgment: Judgment normal.       Assessment & Plan:  1. Routine general medical examination at a health care facility  - CBC with Differential/Platelet; Future - Comprehensive metabolic panel; Future - Hemoglobin A1c; Future - Lipid panel; Future - TSH; Future  2. Essential hypertension, benign  - CBC with Differential/Platelet; Future - Comprehensive metabolic panel; Future - Hemoglobin A1c; Future - Lipid  panel; Future - TSH; Future - lisinopril-hydrochlorothiazide (ZESTORETIC) 20-12.5 MG tablet; Take 1 tablet by mouth daily.  Dispense: 90 tablet; Refill: 3  3. MORBID OBESITY -We had a long discussion about his weight gain.  First and foremost I understand that he is unable to exercise right now but does need to clean up his diet significantly and stop eating fast food.  I think he is passed the point of phentermine and recommended bariatric surgery consultation.  He is nervous, rightfully so about bariatric surgery as he has family members that did not do well with the sleeve nor with banding.  He would like to try another round of phentermine to see if this helps.  I will send in phentermine 15 mg but he needs to follow-up in 30 days for reevaluation.  I asked him to think about bariatric surgery and we could refer her in the future - CBC with Differential/Platelet; Future - Comprehensive metabolic panel; Future - Hemoglobin A1c; Future - Lipid panel; Future - TSH; Future  4. Gastroesophageal reflux disease with esophagitis without hemorrhage - Continue with prilosec  - CBC with Differential/Platelet; Future - Comprehensive metabolic panel; Future - Hemoglobin A1c; Future - Lipid panel; Future - TSH; Future  [name], NP

## 2020-04-20 ENCOUNTER — Other Ambulatory Visit: Payer: Self-pay

## 2020-04-21 ENCOUNTER — Encounter: Payer: Self-pay | Admitting: Adult Health

## 2020-04-21 ENCOUNTER — Ambulatory Visit: Payer: BC Managed Care – PPO | Admitting: Adult Health

## 2020-04-21 MED ORDER — PHENTERMINE HCL 15 MG PO CAPS
15.0000 mg | ORAL_CAPSULE | ORAL | 0 refills | Status: DC
Start: 2020-04-21 — End: 2020-08-25

## 2020-04-21 MED ORDER — PHENTERMINE HCL 15 MG PO CAPS: 15.0000 mg | ORAL_CAPSULE | ORAL | 0 refills | Status: DC

## 2020-04-21 NOTE — Progress Notes (Signed)
Subjective:    Patient ID: Jordan Trujillo, male    DOB: [birth year], 40 y.o.   MRN: [id number]  HPI 40 year old male who  has a past medical history of Chicken pox, DVT (deep venous thrombosis) ([place]), GERD (gastroesophageal reflux disease), Hypertension, Kidney stones, and Morbid obesity ([place]).  He presents to the office today for follow up regarding obesity management and hypertension   During his CPE one month ago he was restarted back on Phentermine 15 mg. He had been on this medication in the past and was able to lose significant weight. At this time he was not exercising nor eating healthy. He was under a lot of stress at work and was working late nights.   He has cut out all fast food and is meal prepping and cutting back on portion sizes   Wt Readings from Last 3 Encounters:  04/21/20 (!) 459 lb 3.2 oz (208.3 kg)  03/24/20 (!) 482 lb (218.6 kg)  02/27/20 (!) 460 lb (208.7 kg)   He is finally having surgery to repair his partial torn meniscus next week   Review of Systems See HPI   Past Medical History:  Diagnosis Date  . Chicken pox   . DVT (deep venous thrombosis) ([place])   . GERD (gastroesophageal reflux disease)   . Hypertension   . Kidney stones   . Morbid obesity ([place])     Social History   Socioeconomic History  . Marital status: Married    Spouse name: Not on file  . Number of children: Not on file  . Years of education: Not on file  . Highest education level: Not on file  Occupational History  . Not on file  Tobacco Use  . Smoking status: Never Smoker  . Smokeless tobacco: Never Used  Substance and Sexual Activity  . Alcohol use: No  . Drug use: No  . Sexual activity: Not on file  Other Topics Concern  . Not on file  Social History Narrative   High School education    Social Determinants of Health   Financial Resource Strain: Not on file  Food Insecurity: Not on file  Transportation Needs: Not on file  Physical Activity: Not on file   Stress: Not on file  Social Connections: Not on file  Intimate Partner Violence: Not on file    Past Surgical History:  Procedure Laterality Date  . KNEE ARTHROSCOPY W/ MENISCAL REPAIR     Left    Family History  Problem Relation Age of Onset  . Diabetes Father   . Hypertension Father   . Arthritis Father   . Heart disease Father   . Breast cancer Maternal Grandmother     Allergies  Allergen Reactions  . Amoxicillin Hives  . Morphine   . Requip [Ropinirole Hcl]     Current Outpatient Medications on File Prior to Visit  Medication Sig Dispense Refill  . lisinopril-hydrochlorothiazide (ZESTORETIC) 20-12.5 MG tablet Take 1 tablet by mouth daily. 90 tablet 3  . omeprazole (PRILOSEC) 20 MG capsule Take 1 capsule (20 mg total) by mouth daily. 90 capsule 3  . phentermine 15 MG capsule Take 1 capsule (15 mg total) by mouth every morning. 30 capsule 0   No current facility-administered medications on file prior to visit.    BP 130/88 (BP Location: Left Arm, Patient Position: Sitting, Cuff Size: Large)   Pulse 97   Temp 97.8 F (36.6 C) (Oral)   Wt (!) 459 lb 3.2 oz (208.3  kg)   SpO2 98%   BMI 62.28 kg/m       Objective:   Physical Exam Vitals and nursing note reviewed.  Constitutional:      Appearance: Normal appearance. He is obese.  Cardiovascular:     Rate and Rhythm: Normal rate and regular rhythm.     Pulses: Normal pulses.     Heart sounds: Normal heart sounds.  Skin:    General: Skin is warm and dry.     Capillary Refill: Capillary refill takes less than 2 seconds.  Neurological:     General: No focal deficit present.     Mental Status: He is alert and oriented to person, place, and time.     Gait: Gait abnormal (walks with cane).  Psychiatric:        Mood and Affect: Mood normal.        Behavior: Behavior normal.        Thought Content: Thought content normal.        Judgment: Judgment normal.        Assessment & Plan:  1. Morbid obesity  ([place]) - Down 23 pounds this month. Advised to continue to work on weight loss though diet. Will send in two months since he will likely be out after surgery .  - Follow up in two months  - phentermine 15 MG capsule; Take 1 capsule (15 mg total) by mouth every morning.  Dispense: 30 capsule; Refill: 0 - phentermine 15 MG capsule; Take 1 capsule (15 mg total) by mouth every morning.  Dispense: 30 capsule; Refill: 0  [name], NP

## 2020-06-23 ENCOUNTER — Ambulatory Visit: Payer: BC Managed Care – PPO | Admitting: Adult Health

## 2020-08-15 ENCOUNTER — Emergency Department
Admission: EM | Admit: 2020-08-15 | Discharge: 2020-08-15 | Disposition: A | Discharge status: Home / Self Care | Payer: BC Managed Care – PPO | Source: Home / Self Care

## 2020-08-15 ENCOUNTER — Other Ambulatory Visit: Payer: Self-pay

## 2020-08-15 ENCOUNTER — Emergency Department (INDEPENDENT_AMBULATORY_CARE_PROVIDER_SITE_OTHER): Payer: BC Managed Care – PPO

## 2020-08-15 ENCOUNTER — Encounter: Payer: Self-pay | Admitting: Emergency Medicine

## 2020-08-15 DIAGNOSIS — M79661 Pain in right lower leg: Secondary | ICD-10-CM | POA: Diagnosis not present

## 2020-08-15 DIAGNOSIS — L03115 Cellulitis of right lower limb: Secondary | ICD-10-CM | POA: Diagnosis not present

## 2020-08-15 DIAGNOSIS — M7989 Other specified soft tissue disorders: Secondary | ICD-10-CM | POA: Diagnosis not present

## 2020-08-15 MED ORDER — SULFAMETHOXAZOLE-TRIMETHOPRIM 800-160 MG PO TABS: 1.0000 | ORAL_TABLET | Freq: Two times a day (BID) | ORAL | 0 refills | Status: DC

## 2020-08-15 NOTE — ED Provider Notes (Signed)
[facility]    CSN: [id number] Arrival date & time: 08/15/20  1102      History   Chief Complaint Chief Complaint  Patient presents with   Knee Injury    HPI Jordan Trujillo is a 40 y.o. male.   HPI 50-year-old male presents with right knee and right lower leg pain for 1 week.  Reports walking up stairs 1 step broke and hit his right knee/shin, has pain with walking.  Past Medical History:  Diagnosis Date   Chicken pox    DVT (deep venous thrombosis) ([facility])    GERD (gastroesophageal reflux disease)    Hypertension    Kidney stones    Morbid obesity [facility])     Patient Active Problem List   Diagnosis Date Noted   MORBID OBESITY 07/07/2009   ESSENTIAL HYPERTENSION, BENIGN 07/07/2009    Past Surgical History:  Procedure Laterality Date   KNEE ARTHROSCOPY W/ MENISCAL REPAIR     Left       Home Medications    Prior to Admission medications   Medication Sig Start Date End Date Taking? Authorizing Provider  lisinopril-hydrochlorothiazide (ZESTORETIC) 20-12.5 MG tablet Take 1 tablet by mouth daily. 03/24/20 08/15/20 Yes Nafziger, Tommi Rumps, NP  omeprazole (PRILOSEC) 20 MG capsule Take 1 capsule (20 mg total) by mouth daily. 03/24/20 08/15/20 Yes Nafziger, Tommi Rumps, NP  sulfamethoxazole-trimethoprim (BACTRIM DS) 800-160 MG tablet Take 1 tablet by mouth 2 (two) times daily for 10 days. 08/15/20 08/25/20 Yes Eliezer Lofts, FNP  phentermine 15 MG capsule Take 1 capsule (15 mg total) by mouth every morning. 04/21/20   [name], NP  phentermine 15 MG capsule Take 1 capsule (15 mg total) by mouth every morning. 04/21/20   Nafziger, Tommi Rumps, NP    Family History Family History  Problem Relation Age of Onset   Diabetes Father    Hypertension Father    Arthritis Father    Heart disease Father    Breast cancer Maternal Grandmother     Social History Social History   Tobacco Use   Smoking status: Never   Smokeless tobacco: Never  Substance Use Topics   Alcohol use:  No   Drug use: No     Allergies   Amoxicillin, Morphine, and Requip [ropinirole hcl]   Review of Systems Review of Systems  Musculoskeletal:        Right lower leg pain x 1 week-secondary to fall  Skin:  Positive for color change and rash.    Physical Exam Triage Vital Signs ED Triage Vitals  Enc Vitals Group     BP 08/15/20 1125 135/87     Pulse Rate 08/15/20 1125 93     Resp 08/15/20 1125 16     Temp 08/15/20 1125 98.6 F (37 C)     Temp Source 08/15/20 1125 Oral     SpO2 08/15/20 1125 94 %     Weight --      Height --      Head Circumference --      Peak Flow --      Pain Score 08/15/20 1122 7     Pain Loc --      Pain Edu? --      Excl. in [place]? --    No data found.  Updated Vital Signs BP 135/87 (BP Location: Right Arm)   Pulse 93   Temp 98.6 F (37 C) (Oral)   Resp 16   SpO2 94%      Physical Exam  Constitutional:      General: He is not in acute distress.    Appearance: Normal appearance. He is obese. He is ill-appearing.  HENT:     Head: Normocephalic and atraumatic.     Mouth/Throat:     Mouth: Mucous membranes are moist.     Pharynx: Oropharynx is clear.  Eyes:     Extraocular Movements: Extraocular movements intact.     Conjunctiva/sclera: Conjunctivae normal.     Pupils: Pupils are equal, round, and reactive to light.  Cardiovascular:     Rate and Rhythm: Normal rate and regular rhythm.     Pulses: Normal pulses.     Heart sounds: Normal heart sounds.  Pulmonary:     Effort: Pulmonary effort is normal.     Breath sounds: Normal breath sounds.     Comments: No adventitious breath sounds noted Musculoskeletal:     Cervical back: Normal range of motion and neck supple. No rigidity or tenderness.     Comments: Right lower leg (anterior aspect): Moderate TTP over mid/inferior tibial surfaces.  Lymphadenopathy:     Cervical: No cervical adenopathy.  Skin:    Comments: Right lower leg (anterior aspect mid tibia to superior ankle):  Erythematous, indurated, fluctuant, with no lymphatic streaking noted.  Neurological:     General: No focal deficit present.     Mental Status: He is alert and oriented to person, place, and time.  Psychiatric:        Mood and Affect: Mood normal.        Behavior: Behavior normal.     UC Treatments / Results  Labs (all labs ordered are listed, but only abnormal results are displayed) Labs Reviewed - No data to display  EKG   Radiology DG Tibia/Fibula Right  Result Date: 08/15/2020 CLINICAL DATA:  Right lower leg pain and swelling with redness EXAM: RIGHT TIBIA AND FIBULA - 2 VIEW COMPARISON:  None. FINDINGS: There is no evidence of fracture or other focal bone lesions. Plantar calcaneal spur. Diffuse soft tissue prominence with indurated appearance of the subcutaneous tissues likely reflecting edema. No soft tissue gas. No radiopaque foreign body. IMPRESSION: 1. No acute osseous abnormality. 2. Diffuse soft tissue prominence with indurated appearance of the subcutaneous tissues likely reflecting edema. Electronically Signed   By: Davina Poke D.O.   On: 08/15/2020 13:00    Procedures Procedures (including critical care time)  Medications Ordered in UC Medications - No data to display  Initial Impression / Assessment and Plan / UC Course  I have reviewed the triage vital signs and the nursing notes.  Pertinent labs & imaging results that were available during my care of the patient were reviewed by me and considered in my medical decision making (see chart for details).     MDM: 1.  Pain in right lower leg-x-ray of right tibia/fibula-negative, given PMH of DVT advised patient if pain worsens and/or unresolved please go to local ED for immediate evaluation 2.  Cellulitis of right lower leg-Rx Bactrim.  Patient discharged home, hemodynamically stable. Final Clinical Impressions(s) / UC Diagnoses   Final diagnoses:  Pain in right lower leg  Cellulitis of leg, right      Discharge Instructions      Advised/instructed patient to take medication as directed with food to completion.  Advised patient if pain worsens and/or unresolved please go to nearest ED for further evaluation to rule out DVT.     ED Prescriptions     Medication Sig Dispense Auth.  Provider   sulfamethoxazole-trimethoprim (BACTRIM DS) 800-160 MG tablet Take 1 tablet by mouth 2 (two) times daily for 10 days. 20 tablet Eliezer Lofts, FNP      PDMP not reviewed this encounter.   Eliezer Lofts, [place] 08/15/20 1331

## 2020-08-15 NOTE — ED Triage Notes (Signed)
Patient presents to Urgent Care with complaints of right knee and lower leg pain since 1 week ago. Patient reports walking up the stairs the step broke and he hit his shin and knee. Having pain with walking, getting up from sitting to standing and any light touch. Having redness and swelling of the lower leg. Has tried ice to the area.

## 2020-08-15 NOTE — Discharge Instructions (Addendum)
Advised/instructed patient to take medication as directed with food to completion.  Advised patient if pain worsens and/or unresolved please go to nearest ED for further evaluation to rule out DVT.

## 2020-08-24 ENCOUNTER — Other Ambulatory Visit: Payer: Self-pay

## 2020-08-25 ENCOUNTER — Encounter: Payer: Self-pay | Admitting: Adult Health

## 2020-08-25 ENCOUNTER — Ambulatory Visit: Payer: BC Managed Care – PPO | Admitting: Adult Health

## 2020-08-25 VITALS — BP 148/90 | HR 108 | Temp 98.5°F | Ht 72.0 in | Wt >= 6400 oz

## 2020-08-25 DIAGNOSIS — L03115 Cellulitis of right lower limb: Secondary | ICD-10-CM | POA: Diagnosis not present

## 2020-08-25 MED ORDER — PHENTERMINE HCL 30 MG PO CAPS: 30.0000 mg | ORAL_CAPSULE | ORAL | 0 refills | Status: DC

## 2020-08-25 MED ORDER — SULFAMETHOXAZOLE-TRIMETHOPRIM 800-160 MG PO TABS: 1.0000 | ORAL_TABLET | Freq: Two times a day (BID) | ORAL | 0 refills | Status: DC

## 2020-08-25 NOTE — Progress Notes (Signed)
Subjective:    Patient ID: Jordan Trujillo, male    DOB: [birth year], 40 y.o.   MRN: [id number]  HPI 40 year old male who  has a past medical history of Chicken pox, DVT (deep venous thrombosis) ([place]), GERD (gastroesophageal reflux disease), Hypertension, Kidney stones, and Morbid obesity ([place]).  He was seen at urgent care on 08/15/2020 with right knee and right lower leg pain x1 week.  His exam revealed moderate TTP over mid/inferior tibial surfaces as well as erythematous, indurated, fluctuant with no lymphatic streaking noted to the anterior aspect of the mid tibia to the superior ankle.  Work-up included x-ray of the right tib-fib which was negative as well as starting him on Bactrim for cellulitis of right lower leg.  He reports that his cellulitis has improved 100%, today is his last day on Bactrim.  Leg still continues to be slightly red, warm, and tender to touch but is nothing like when he was first evaluated  Wt Readings from Last 3 Encounters:  08/25/20 (!) 472 lb (214.1 kg)  04/21/20 (!) 459 lb 3.2 oz (208.3 kg)  03/24/20 (!) 482 lb (218.6 kg)   Additionally, he would like to go back on phentermine to help with weight loss.  In March 2022 he had right knee arthroplasty and repair of partial medial meniscus tear.  He has been doing well status post surgery and is back to more active and eating healthier.  His weight has gone up after surgery.  He is also looking into bariatric surgery in the future   Review of Systems See HPI   Past Medical History:  Diagnosis Date   Chicken pox    DVT (deep venous thrombosis) (HCC)    GERD (gastroesophageal reflux disease)    Hypertension    Kidney stones    Morbid obesity (HCC)     Social History   Socioeconomic History   Marital status: Married    Spouse name: Not on file   Number of children: Not on file   Years of education: Not on file   Highest education level: Not on file  Occupational History   Not on file  Tobacco Use    Smoking status: Never   Smokeless tobacco: Never  Substance and Sexual Activity   Alcohol use: No   Drug use: No   Sexual activity: Not on file  Other Topics Concern   Not on file  Social History Narrative   High School education    Social Determinants of Health   Financial Resource Strain: Not on file  Food Insecurity: Not on file  Transportation Needs: Not on file  Physical Activity: Not on file  Stress: Not on file  Social Connections: Not on file  Intimate Partner Violence: Not on file    Past Surgical History:  Procedure Laterality Date   KNEE ARTHROSCOPY W/ MENISCAL REPAIR     Left    Family History  Problem Relation Age of Onset   Diabetes Father    Hypertension Father    Arthritis Father    Heart disease Father    Breast cancer Maternal Grandmother     Allergies  Allergen Reactions   Amoxicillin Hives   Morphine Other (See Comments)    angry   Requip [Ropinirole Hcl] Other (See Comments)    Kept awake for 3 days    Current Outpatient Medications on File Prior to Visit  Medication Sig Dispense Refill   lisinopril-hydrochlorothiazide (ZESTORETIC) 20-12.5 MG tablet Take 1 tablet  by mouth daily. 90 tablet 3   omeprazole (PRILOSEC) 20 MG capsule Take 1 capsule (20 mg total) by mouth daily. 90 capsule 3   No current facility-administered medications on file prior to visit.    BP (!) 148/90   Pulse (!) 108   Temp 98.5 F (36.9 C) (Oral)   Ht 6' (1.829 m)   Wt (!) 472 lb (214.1 kg)   SpO2 98%   BMI 64.01 kg/m       Objective:   Physical Exam Vitals and nursing note reviewed.  Constitutional:      Appearance: Normal appearance. He is obese.  Cardiovascular:     Rate and Rhythm: Normal rate and regular rhythm.     Pulses: Normal pulses.     Heart sounds: Normal heart sounds.  Pulmonary:     Effort: Pulmonary effort is normal.     Breath sounds: Normal breath sounds.  Skin:    General: Skin is warm and dry.     Comments: Continues to  have some mild erythema, swelling, redness, and warmth to right lower leg.  No active drainage noted, no streaking.  Neurological:     General: No focal deficit present.     Mental Status: He is alert and oriented to person, place, and time.  Psychiatric:        Mood and Affect: Mood normal.        Behavior: Behavior normal.      Assessment & Plan:  1. Cellulitis of right leg -Keep on Bactrim for another 7 days to make sure his cellulitis fully resolves.  Follow-up if not resolved by the end of the course. - sulfamethoxazole-trimethoprim (BACTRIM DS) 800-160 MG tablet; Take 1 tablet by mouth 2 (two) times daily.  Dispense: 14 tablet; Refill: 0  2. Morbid obesity ([place]) -Restarted on phentermine.  Can consider Wegovy in the future. - phentermine 30 MG capsule; Take 1 capsule (30 mg total) by mouth every morning.  Dispense: 30 capsule; Refill: 0 -Follow-up in 30 days  [name], NP  Time spent on chart review, time with patient; discussion of cellulitis and obesity home monitoring , treatment, follow up plan, and documentation 30 minutes

## 2020-09-05 ENCOUNTER — Encounter: Payer: Self-pay | Admitting: Adult Health

## 2020-09-07 NOTE — Telephone Encounter (Signed)
Please advise per last ov notes:  2. Morbid obesity ([place]) -Restarted on phentermine.  Can consider Wegovy in the future. - phentermine 30 MG capsule; Take 1 capsule (30 mg total) by mouth every morning.  Dispense: 30 capsule; Refill: 0 -Follow-up in 30 days   Ok to schedule an appt.?

## 2020-09-22 ENCOUNTER — Encounter: Payer: Self-pay | Admitting: Adult Health

## 2020-09-22 ENCOUNTER — Other Ambulatory Visit: Payer: Self-pay

## 2020-09-22 ENCOUNTER — Ambulatory Visit: Payer: BC Managed Care – PPO | Admitting: Adult Health

## 2020-09-22 VITALS — BP 124/98 | HR 115 | Temp 98.4°F | Ht 72.0 in | Wt >= 6400 oz

## 2020-09-22 DIAGNOSIS — K2100 Gastro-esophageal reflux disease with esophagitis, without bleeding: Secondary | ICD-10-CM

## 2020-09-22 MED ORDER — OMEPRAZOLE 20 MG PO CPDR: 20.0000 mg | DELAYED_RELEASE_CAPSULE | Freq: Every day | ORAL | 3 refills | Status: DC

## 2020-09-22 MED ORDER — PHENTERMINE HCL 30 MG PO CAPS: 30.0000 mg | ORAL_CAPSULE | ORAL | 0 refills | Status: DC

## 2020-09-22 NOTE — Progress Notes (Addendum)
Subjective:    Patient ID: Jordan Trujillo, male    DOB: [birth year], 40 y.o.   MRN: [id number]  HPI 40 year old male who  has a past medical history of Chicken pox, DVT (deep venous thrombosis) ([place]), GERD (gastroesophageal reflux disease), Hypertension, Kidney stones, and Morbid obesity ([place]).  He presents to the office today for one month follow up regarding obesity management and refill of on GERD medication.  Last month he was restarted back on phentermine to help with his weight loss..  In March 2022 he had right knee arthroplasty and repair of partial medial meniscus tear.  He had been doing well status post surgery and was back to being more active eating healthier.  His weight had gone up after surgery.  He was placed back on phentermine 30 mg.  He was also looking into weight loss surgery.  Today he reports that he had issues with " heavy chest, SOB, and tachycardia while taking the phentermine. He cut back on coffee consumption and his symptoms resolved.   He is being more active and back to eating healthier.   Wt Readings from Last 3 Encounters:  09/22/20 (!) 452 lb (205 kg)  08/25/20 (!) 472 lb (214.1 kg)  04/21/20 (!) 459 lb 3.2 oz (208.3 kg)    Review of Systems See HPI   Past Medical History:  Diagnosis Date   Chicken pox    DVT (deep venous thrombosis) (HCC)    GERD (gastroesophageal reflux disease)    Hypertension    Kidney stones    Morbid obesity ([place])     Social History   Socioeconomic History   Marital status: Married    Spouse name: Not on file   Number of children: Not on file   Years of education: Not on file   Highest education level: Not on file  Occupational History   Not on file  Tobacco Use   Smoking status: Never   Smokeless tobacco: Never  Substance and Sexual Activity   Alcohol use: No   Drug use: No   Sexual activity: Not on file  Other Topics Concern   Not on file  Social History Narrative   High School education    Social  Determinants of Health   Financial Resource Strain: Not on file  Food Insecurity: Not on file  Transportation Needs: Not on file  Physical Activity: Not on file  Stress: Not on file  Social Connections: Not on file  Intimate Partner Violence: Not on file    Past Surgical History:  Procedure Laterality Date   KNEE ARTHROSCOPY W/ MENISCAL REPAIR     Left    Family History  Problem Relation Age of Onset   Diabetes Father    Hypertension Father    Arthritis Father    Heart disease Father    Breast cancer Maternal Grandmother     Allergies  Allergen Reactions   Amoxicillin Hives   Morphine Other (See Comments)    angry   Requip [Ropinirole Hcl] Other (See Comments)    Kept awake for 3 days    Current Outpatient Medications on File Prior to Visit  Medication Sig Dispense Refill   lisinopril-hydrochlorothiazide (ZESTORETIC) 20-12.5 MG tablet Take 1 tablet by mouth daily. 90 tablet 3   omeprazole (PRILOSEC) 20 MG capsule Take 1 capsule (20 mg total) by mouth daily. 90 capsule 3   phentermine 30 MG capsule Take 1 capsule (30 mg total) by mouth every morning. 30 capsule 0  sulfamethoxazole-trimethoprim (BACTRIM DS) 800-160 MG tablet Take 1 tablet by mouth 2 (two) times daily. 14 tablet 0   No current facility-administered medications on file prior to visit.    There were no vitals taken for this visit.      Objective:   Physical Exam Vitals and nursing note reviewed.  Constitutional:      Appearance: Normal appearance.  Cardiovascular:     Rate and Rhythm: Normal rate and regular rhythm.     Pulses: Normal pulses.     Heart sounds: Normal heart sounds.  Pulmonary:     Effort: Pulmonary effort is normal.     Breath sounds: Normal breath sounds.  Musculoskeletal:        General: Normal range of motion.  Skin:    General: Skin is warm and dry.  Neurological:     General: No focal deficit present.     Mental Status: He is alert and oriented to person, place, and  time.  Psychiatric:        Mood and Affect: Mood normal.        Behavior: Behavior normal.        Thought Content: Thought content normal.      Assessment & Plan:    1. Gastroesophageal reflux disease with esophagitis without hemorrhage  - omeprazole (PRILOSEC) 20 MG capsule; Take 1 capsule (20 mg total) by mouth daily.  Dispense: 90 capsule; Refill: 3  2. Morbid obesity (HCC) - Down 20 pounds  - phentermine 30 MG capsule; Take 1 capsule (30 mg total) by mouth every morning.  Dispense: 30 capsule; Refill: 0  [name], NP  Error on original documentation, as this visit was not coded for refill of GERD medications

## 2020-09-22 NOTE — Patient Instructions (Addendum)
You are down 20 pounds! Great job!   I will see you back  in three month.   [email or web address]

## 2020-10-12 ENCOUNTER — Encounter: Payer: Self-pay | Admitting: Adult Health

## 2020-10-14 NOTE — Telephone Encounter (Signed)
Please advise 

## 2020-10-27 ENCOUNTER — Ambulatory Visit: Payer: BC Managed Care – PPO | Admitting: Adult Health

## 2020-11-02 ENCOUNTER — Other Ambulatory Visit: Payer: Self-pay

## 2020-11-03 ENCOUNTER — Encounter: Payer: Self-pay | Admitting: Adult Health

## 2020-11-03 ENCOUNTER — Ambulatory Visit: Payer: BC Managed Care – PPO | Admitting: Adult Health

## 2020-11-03 VITALS — BP 122/80 | HR 84 | Temp 98.6°F | Ht 72.0 in | Wt >= 6400 oz

## 2020-11-03 DIAGNOSIS — D492 Neoplasm of unspecified behavior of bone, soft tissue, and skin: Secondary | ICD-10-CM | POA: Diagnosis not present

## 2020-11-03 MED ORDER — DOXYCYCLINE HYCLATE 50 MG PO CAPS: 50.0000 mg | ORAL_CAPSULE | Freq: Two times a day (BID) | ORAL | 1 refills | Status: AC

## 2020-11-03 MED ORDER — PHENTERMINE HCL 30 MG PO CAPS: 30.0000 mg | ORAL_CAPSULE | ORAL | 0 refills | Status: DC

## 2020-11-03 NOTE — Progress Notes (Signed)
Subjective:    Patient ID: Jordan Trujillo, male    DOB: 1980/05/12, 40 y.o.   MRN: [id number]  HPI  40 year old male who  has a past medical history of Chicken pox, DVT (deep venous thrombosis) ([place]), GERD (gastroesophageal reflux disease), Hypertension, Kidney stones, and Morbid obesity ([place]).  40 year old male who presents to the office today for 1 month follow-up regarding  obesity management and skin issues.   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 Will not go away and seem to getting larger. No drainage from neoplasms.    Morbid Obesity -currently prescribed phentermine 30 mg daily.  Not currently having any side effects of the medication.  He is also looking at weight loss surgery. Reports a tough month, went on vacation for a week, refrigerator died and took a few days to be delivered and he is short staffed at work and has been having to work six days a week.  Wt Readings from Last 3 Encounters:  11/03/20 (!) 450 lb (204.1 kg)  09/22/20 (!) 452 lb (205 kg)  08/25/20 (!) 472 lb (214.1 kg)   Review of Systems See HPI   Past Medical History:  Diagnosis Date   Chicken pox    DVT (deep venous thrombosis) (HCC)    GERD (gastroesophageal reflux disease)    Hypertension    Kidney stones    Morbid obesity ([place])     Social History   Socioeconomic History   Marital status: Married    Spouse name: Not on file   Number of children: Not on file   Years of education: Not on file   Highest education level: Not on file  Occupational History   Not on file  Tobacco Use   Smoking status: Never   Smokeless tobacco: Never  Substance and Sexual Activity   Alcohol use: No   Drug use: No   Sexual activity: Not on file  Other Topics Concern   Not on file  Social History Narrative   High School education    Social Determinants of Health   Financial Resource Strain: Not on file  Food Insecurity:  Not on file  Transportation Needs: Not on file  Physical Activity: Not on file  Stress: Not on file  Social Connections: Not on file  Intimate Partner Violence: Not on file    Past Surgical History:  Procedure Laterality Date   KNEE ARTHROSCOPY W/ MENISCAL REPAIR     Left    Family History  Problem Relation Age of Onset   Diabetes Father    Hypertension Father    Arthritis Father    Heart disease Father    Breast cancer Maternal Grandmother     Allergies  Allergen Reactions   Amoxicillin Hives   Morphine Other (See Comments)    angry   Requip [Ropinirole Hcl] Other (See Comments)    Kept awake for 3 days    Current Outpatient Medications on File Prior to Visit  Medication Sig Dispense Refill   omeprazole (PRILOSEC) 20 MG capsule Take 1 capsule (20 mg total) by mouth daily. 90 capsule 3   phentermine 30 MG capsule Take 1 capsule (30 mg total) by mouth every morning. 30 capsule 0   sulfamethoxazole-trimethoprim (BACTRIM DS) 800-160 MG tablet Take 1 tablet by mouth 2 (two) times daily. 14 tablet 0   lisinopril-hydrochlorothiazide (ZESTORETIC) 20-12.5 MG tablet Take 1 tablet by mouth  daily. 90 tablet 3   No current facility-administered medications on file prior to visit.    BP 122/80   Pulse 84   Temp 98.6 F (37 C) (Oral)   Ht 6' (1.829 m)   Wt (!) 450 lb (204.1 kg)   SpO2 97%   BMI 61.03 kg/m       Objective:   Physical Exam Vitals and nursing note reviewed.  Constitutional:      Appearance: Normal appearance.  Cardiovascular:     Rate and Rhythm: Normal rate and regular rhythm.     Pulses: Normal pulses.     Heart sounds: Normal heart sounds.  Pulmonary:     Effort: Pulmonary effort is normal.     Breath sounds: Normal breath sounds.  Musculoskeletal:        General: Normal range of motion.  Skin:    General: Skin is warm and dry.     Capillary Refill: Capillary refill takes less than 2 seconds.     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  Appear as cystic acne. One nodule behind left ear.   Neurological:     General: No focal deficit present.     Mental Status: He is alert and oriented to person, place, and time.  Psychiatric:        Mood and Affect: Mood normal.        Behavior: Behavior normal.        Thought Content: Thought content normal.        Judgment: Judgment normal.      Assessment & Plan:   1. Skin neoplasm  - Ambulatory referral to Dermatology - doxycycline (VIBRAMYCIN) 50 MG capsule; Take 1 capsule (50 mg total) by mouth 2 (two) times daily.  Dispense: 60 capsule; Refill: 1  2. Morbid obesity ([place]) - phentermine 30 MG capsule; Take 1 capsule (30 mg total) by mouth every morning.  Dispense: 30 capsule; Refill: 0 - Hopefully start Wegovy next month    [name], NP

## 2020-12-01 ENCOUNTER — Ambulatory Visit: Payer: BC Managed Care – PPO | Admitting: Adult Health

## 2020-12-07 ENCOUNTER — Other Ambulatory Visit: Payer: Self-pay

## 2020-12-08 ENCOUNTER — Encounter: Payer: Self-pay | Admitting: Adult Health

## 2020-12-08 ENCOUNTER — Ambulatory Visit: Payer: BC Managed Care – PPO | Admitting: Adult Health

## 2020-12-08 VITALS — BP 140/86 | HR 73 | Temp 98.0°F | Ht 72.0 in | Wt >= 6400 oz

## 2020-12-08 DIAGNOSIS — D492 Neoplasm of unspecified behavior of bone, soft tissue, and skin: Secondary | ICD-10-CM

## 2020-12-08 DIAGNOSIS — R7303 Prediabetes: Secondary | ICD-10-CM | POA: Diagnosis not present

## 2020-12-08 NOTE — Progress Notes (Signed)
Subjective:    Patient ID: Jordan Trujillo, male    DOB: 31-May-1980, 40 y.o.   MRN: [id number]  HPI 40 year old male who  has a past medical history of Chicken pox, DVT (deep venous thrombosis) ([place]), GERD (gastroesophageal reflux disease), Hypertension, Kidney stones, and Morbid obesity ([place]).  He presents to the office today for follow-up regarding multiple issues.  He was seen last month he had developed some cystic acne on his chin and behind his right ear.  He was scribed doxycycline 50 mg twice daily, today he reports that he has done well with this medication most of the cystic bumps have resolved except for one on his chin.  He has not refilled the second month of antibiotics yet.  Does have an appointment with dermatology in late January  He also is being seen today for 1 month follow-up regarding obesity and weight loss management.  Currently prescribed phentermine 30 mg daily.  Feels as though this is no longer working for him, is not curbing his appetite and the weight is not coming off as easily as it once did.  Does have a history of prediabetes with his last A1c being 6.3. Lab Results  Component Value Date   HGBA1C 6.3 03/24/2020   Review of Systems See HPI   Past Medical History:  Diagnosis Date   Chicken pox    DVT (deep venous thrombosis) (HCC)    GERD (gastroesophageal reflux disease)    Hypertension    Kidney stones    Morbid obesity ([place])     Social History   Socioeconomic History   Marital status: Married    Spouse name: Not on file   Number of children: Not on file   Years of education: Not on file   Highest education level: Not on file  Occupational History   Not on file  Tobacco Use   Smoking status: Never   Smokeless tobacco: Never  Substance and Sexual Activity   Alcohol use: No   Drug use: No   Sexual activity: Not on file  Other Topics Concern   Not on file  Social History Narrative   High School education    Social Determinants  of Health   Financial Resource Strain: Not on file  Food Insecurity: Not on file  Transportation Needs: Not on file  Physical Activity: Not on file  Stress: Not on file  Social Connections: Not on file  Intimate Partner Violence: Not on file    Past Surgical History:  Procedure Laterality Date   KNEE ARTHROSCOPY W/ MENISCAL REPAIR     Left    Family History  Problem Relation Age of Onset   Diabetes Father    Hypertension Father    Arthritis Father    Heart disease Father    Breast cancer Maternal Grandmother     Allergies  Allergen Reactions   Amoxicillin Hives   Morphine Other (See Comments)    angry   Requip [Ropinirole Hcl] Other (See Comments)    Kept awake for 3 days    Current Outpatient Medications on File Prior to Visit  Medication Sig Dispense Refill   omeprazole (PRILOSEC) 20 MG capsule Take 1 capsule (20 mg total) by mouth daily. 90 capsule 3   phentermine 30 MG capsule Take 1 capsule (30 mg total) by mouth every morning. 30 capsule 0   sulfamethoxazole-trimethoprim (BACTRIM DS) 800-160 MG tablet Take 1 tablet by mouth 2 (two) times daily. 14 tablet 0  lisinopril-hydrochlorothiazide (ZESTORETIC) 20-12.5 MG tablet Take 1 tablet by mouth daily. 90 tablet 3   No current facility-administered medications on file prior to visit.    BP 140/86   Pulse 73   Temp 98 F (36.7 C) (Oral)   Ht 6' (1.829 m)   Wt (!) 459 lb (208.2 kg)   SpO2 98%   BMI 62.25 kg/m       Objective:   Physical Exam Vitals and nursing note reviewed.  Constitutional:      Appearance: Normal appearance. He is obese.  Cardiovascular:     Rate and Rhythm: Normal rate and regular rhythm.     Pulses: Normal pulses.     Heart sounds: Normal heart sounds.  Pulmonary:     Effort: Pulmonary effort is normal.     Breath sounds: Normal breath sounds.  Skin:    General: Skin is warm and dry.     Comments: Cystic acne noted on chin  Neurological:     General: No focal deficit  present.     Mental Status: He is alert and oriented to person, place, and time.  Psychiatric:        Mood and Affect: Mood normal.        Behavior: Behavior normal.        Thought Content: Thought content normal.        Judgment: Judgment normal.       Assessment & Plan:  1. Skin neoplasm - continue with Doxycycline  2. Morbid obesity ([place]) -We will have him stop phentermine as does not seem to be working any longer  3. Pre-diabetes -We will place him on him on Mounjaro. Sample month of 2.5 mg given  - Follow up in one month or sooner if needed  [name], NP

## 2020-12-21 ENCOUNTER — Encounter: Payer: Self-pay | Admitting: Adult Health

## 2020-12-22 ENCOUNTER — Emergency Department
Admission: EM | Admit: 2020-12-22 | Discharge: 2020-12-22 | Disposition: A | Discharge status: Home / Self Care | Payer: BC Managed Care – PPO | Attending: Emergency Medicine | Admitting: Emergency Medicine

## 2020-12-22 ENCOUNTER — Telehealth: Payer: Self-pay

## 2020-12-22 ENCOUNTER — Other Ambulatory Visit: Payer: Self-pay

## 2020-12-22 ENCOUNTER — Encounter: Payer: Self-pay | Admitting: Emergency Medicine

## 2020-12-22 ENCOUNTER — Emergency Department: Payer: BC Managed Care – PPO

## 2020-12-22 DIAGNOSIS — I10 Essential (primary) hypertension: Secondary | ICD-10-CM | POA: Diagnosis not present

## 2020-12-22 DIAGNOSIS — Z79899 Other long term (current) drug therapy: Secondary | ICD-10-CM | POA: Diagnosis not present

## 2020-12-22 DIAGNOSIS — I8001 Phlebitis and thrombophlebitis of superficial vessels of right lower extremity: Secondary | ICD-10-CM | POA: Diagnosis not present

## 2020-12-22 DIAGNOSIS — M7989 Other specified soft tissue disorders: Secondary | ICD-10-CM | POA: Diagnosis present

## 2020-12-22 MED ORDER — NAPROXEN 500 MG PO TABS: 500.0000 mg | ORAL_TABLET | Freq: Two times a day (BID) | ORAL | 0 refills | Status: AC

## 2020-12-22 MED ORDER — NAPROXEN 500 MG PO TABS: 500.0000 mg | ORAL_TABLET | Freq: Two times a day (BID) | ORAL | 0 refills | Status: DC

## 2020-12-22 MED ORDER — CEPHALEXIN 500 MG PO CAPS: 500.0000 mg | ORAL_CAPSULE | Freq: Four times a day (QID) | ORAL | 0 refills | Status: AC

## 2020-12-22 MED ORDER — CEPHALEXIN 500 MG PO CAPS
500.0000 mg | ORAL_CAPSULE | Freq: Once | ORAL | Status: AC
  Administered 2020-12-22: 500 mg via ORAL
  Filled 2020-12-22: qty 1

## 2020-12-22 MED ORDER — KETOROLAC TROMETHAMINE 30 MG/ML IJ SOLN
30.0000 mg | Freq: Once | INTRAMUSCULAR | Status: AC
  Administered 2020-12-22: 30 mg via INTRAMUSCULAR
  Filled 2020-12-22: qty 1

## 2020-12-22 MED ORDER — CEPHALEXIN 500 MG PO CAPS: 500.0000 mg | ORAL_CAPSULE | Freq: Four times a day (QID) | ORAL | 0 refills | Status: DC

## 2020-12-22 NOTE — Telephone Encounter (Signed)
Noted  

## 2020-12-22 NOTE — ED Triage Notes (Signed)
Pt reports swelling and redness to right calf for the past couple of days. Pt reports painful, has hx of blood clots and this feels the same.

## 2020-12-22 NOTE — Telephone Encounter (Signed)
Patient called to schedule an appt and was transferred to a triage nurse who recommended patient be seen in 4 hrs their were no appt available and patient was told to seek treatment at an Urgent care or ED

## 2020-12-22 NOTE — ED Provider Notes (Signed)
[facility] Emergency Department Provider Note   ____________________________________________   Event Date/Time   First MD Initiated Contact with Patient 12/22/20 1044     (approximate)  I have reviewed the triage vital signs and the nursing notes.   HISTORY  Chief Complaint Leg Swelling    HPI Jordan Goodley is a 40 y.o. male with past medical history of hypertension, DVT, and GERD who presents to the ED complaining of leg swelling.  Patient reports that he has been dealing with 3 days of increasing swelling to his right lower extremity.  He had noticed a painful nodule to his right lateral calf and since then the leg has become more red, swollen, and painful.  He is able to walk on the leg but states it is painful to do so.  He denies any recent trauma to the leg.  He describes symptoms as similar to when he was diagnosed with a DVT in the past but he is not currently on any blood thinners.  He denies any pain in his chest or difficulty breathing.  He has not had any drainage from the leg and denies any fevers.  He has been maintaining on a course of doxycycline through his PCP for the past month due to various skin nodules that have become infected.  He states these areas are improving and he has follow-up with dermatology in 1 month.        Past Medical History:  Diagnosis Date   Chicken pox    DVT (deep venous thrombosis) (HCC)    GERD (gastroesophageal reflux disease)    Hypertension    Kidney stones    Morbid obesity [facility])     Patient Active Problem List   Diagnosis Date Noted   MORBID OBESITY 07/07/2009   ESSENTIAL HYPERTENSION, BENIGN 07/07/2009    Past Surgical History:  Procedure Laterality Date   KNEE ARTHROSCOPY W/ MENISCAL REPAIR     Left    Prior to Admission medications   Medication Sig Start Date End Date Taking? Authorizing Provider  cephALEXin (KEFLEX) 500 MG capsule Take 1 capsule (500 mg total) by mouth 4 (four) times  daily for 7 days. 12/22/20 12/29/20 Yes Blake Divine, MD  naproxen (NAPROSYN) 500 MG tablet Take 1 tablet (500 mg total) by mouth 2 (two) times daily with a meal for 6 days. 12/22/20 12/28/20 Yes Blake Divine, MD  lisinopril-hydrochlorothiazide (ZESTORETIC) 20-12.5 MG tablet Take 1 tablet by mouth daily. 03/24/20 08/15/20  Nafziger, Tommi Rumps, NP  omeprazole (PRILOSEC) 20 MG capsule Take 1 capsule (20 mg total) by mouth daily. 09/22/20 12/21/20  Nafziger, Tommi Rumps, NP  phentermine 30 MG capsule Take 1 capsule (30 mg total) by mouth every morning. 11/03/20   Nafziger, Tommi Rumps, NP  sulfamethoxazole-trimethoprim (BACTRIM DS) 800-160 MG tablet Take 1 tablet by mouth 2 (two) times daily. 08/25/20   Nafziger, Tommi Rumps, NP  tirzepatide [facility]) 2.5 MG/0.5ML Pen Inject 2.5 mg into the skin once a week.    [provider]    Allergies Amoxicillin, Morphine, and Requip [ropinirole hcl]  Family History  Problem Relation Age of Onset   Diabetes Father    Hypertension Father    Arthritis Father    Heart disease Father    Breast cancer Maternal Grandmother     Social History Social History   Tobacco Use   Smoking status: Never   Smokeless tobacco: Never  Substance Use Topics   Alcohol use: No   Drug use: No  Review of Systems  Constitutional: No fever/chills Eyes: No visual changes. ENT: No sore throat. Cardiovascular: Denies chest pain. Respiratory: Denies shortness of breath. Gastrointestinal: No abdominal pain.  No nausea, no vomiting.  No diarrhea.  No constipation. Genitourinary: Negative for dysuria. Musculoskeletal: Negative for back pain.  Positive for leg pain and swelling. Skin: Negative for rash. Neurological: Negative for headaches, focal weakness or numbness.  ____________________________________________   PHYSICAL EXAM:  VITAL SIGNS: ED Triage Vitals  Enc Vitals Group     BP 12/22/20 1005 (!) 148/86     Pulse Rate 12/22/20 1005 89     Resp 12/22/20 1005 20      Temp 12/22/20 1005 97.8 F (36.6 C)     Temp Source 12/22/20 1005 Oral     SpO2 12/22/20 1005 98 %     Weight 12/22/20 0958 (!) 460 lb 12.2 oz (209 kg)     Height 12/22/20 0958 6' (1.829 m)     Head Circumference --      Peak Flow --      Pain Score 12/22/20 0958 7     Pain Loc --      Pain Edu? --      Excl. in [place]? --     Constitutional: Alert and oriented. Eyes: Conjunctivae are normal. Head: Atraumatic. Nose: No congestion/rhinnorhea. Mouth/Throat: Mucous membranes are moist. Neck: Normal ROM Cardiovascular: Normal rate, regular rhythm. Grossly normal heart sounds.  2+ DP pulses bilaterally. Respiratory: Normal respiratory effort.  No retractions. Lungs CTAB. Gastrointestinal: Soft and nontender. No distention. Genitourinary: deferred Musculoskeletal: Erythema, edema, and warmth noted to right lower extremity distal to the knee with tender nodule to right lateral proximal calf.  No purulence or drainage noted but area is diffusely tender to palpation.  Varicose veins noted. Neurologic:  Normal speech and language. No gross focal neurologic deficits are appreciated. Skin:  Skin is warm, dry and intact. No rash noted. Psychiatric: Mood and affect are normal. Speech and behavior are normal.  ____________________________________________   LABS (all labs ordered are listed, but only abnormal results are displayed)  Labs Reviewed - No data to display   PROCEDURES  Procedure(s) performed (including Critical Care):  Procedures   ____________________________________________   INITIAL IMPRESSION / ASSESSMENT AND PLAN / ED COURSE      40 year old male with past medical history of hypertension, DVT, and GERD who presents to the ED complaining of redness, pain, and swelling to his distal right lower extremity for the past 3 days.  He is neurovascular intact to his right lower extremity and denies any recent trauma.  Ultrasound was obtained and is negative for DVT but does  appear consistent with superficial venous thrombosis.  He appears to be developing associated cellulitis, has recently been on doxycycline but we will add Keflex for better strep coverage.  We will start patient on NSAIDs, he was counseled on using compression stockings as well as elevation, and we will also refer to vascular surgery for follow-up.  He was counseled to return to the ED for new or worsening symptoms, patient agrees with plan.      ____________________________________________   FINAL CLINICAL IMPRESSION(S) / ED DIAGNOSES  Final diagnoses:  Thrombophlebitis of superficial veins of right lower extremity     ED Discharge Orders          Ordered    naproxen (NAPROSYN) 500 MG tablet  2 times daily with meals        12/22/20 1230    cephALEXin (  KEFLEX) 500 MG capsule  4 times daily        12/22/20 1230             Note:  This document was prepared using Dragon voice recognition software and may include unintentional dictation errors.    Blake Divine, MD 12/22/20 1230

## 2020-12-29 ENCOUNTER — Ambulatory Visit: Payer: BC Managed Care – PPO | Admitting: Adult Health

## 2020-12-29 ENCOUNTER — Encounter: Payer: Self-pay | Admitting: Adult Health

## 2020-12-29 VITALS — BP 110/72 | HR 83 | Temp 98.3°F | Ht 72.0 in | Wt >= 6400 oz

## 2020-12-29 DIAGNOSIS — L03115 Cellulitis of right lower limb: Secondary | ICD-10-CM

## 2020-12-29 DIAGNOSIS — I82811 Embolism and thrombosis of superficial veins of right lower extremities: Secondary | ICD-10-CM | POA: Diagnosis not present

## 2020-12-29 NOTE — Progress Notes (Signed)
Subjective:    Patient ID: Jordan Trujillo, male    DOB: December 02, 1980, 40 y.o.   MRN: [id number]  HPI 40 year old male who  has a past medical history of Chicken pox, DVT (deep venous thrombosis) ([place]), GERD (gastroesophageal reflux disease), Hypertension, Kidney stones, and Morbid obesity ([place]).  He presents to the office today for follow-up after being seen in the emergency room roughly 7 days ago.  He went to the ER complaining of leg swelling.  At this time he reported 3 days and increased swelling to his right lower extremity.  He had noticed a painful nodule to his right lateral calf and since then his leg had become more red, swollen, and painful.  He was able to walk on the leg but stated that it was painful to do so.  Denied any recent trauma to the leg.  Symptoms were similar to when he was diagnosed with a DVT in the past.  He denied any chest pain or difficulty breathing.  He had been on a course of doxycycline as prescribed by this writer for various skin nodules on his face.  Ultrasound was obtained which was negative for DVT but does appear consistent with superficial venous thrombosis.  He appears to be developing associated cellulitis.  Keflex was added for better strep coverage.  He was also referred to vascular surgery for follow-up.  Today in the office he reports that symptoms below his knee seem to be resolving but he has developed some redness and tenderness in his upper thigh.  He continues to take the antibiotic regimen and has another 3 days of antibiotics   Review of Systems See HPI   Past Medical History:  Diagnosis Date   Chicken pox    DVT (deep venous thrombosis) (HCC)    GERD (gastroesophageal reflux disease)    Hypertension    Kidney stones    Morbid obesity (HCC)     Social History   Socioeconomic History   Marital status: Married    Spouse name: Not on file   Number of children: Not on file   Years of education: Not on file   Highest education  level: Not on file  Occupational History   Not on file  Tobacco Use   Smoking status: Never   Smokeless tobacco: Never  Substance and Sexual Activity   Alcohol use: No   Drug use: No   Sexual activity: Not on file  Other Topics Concern   Not on file  Social History Narrative   High School education    Social Determinants of Health   Financial Resource Strain: Not on file  Food Insecurity: Not on file  Transportation Needs: Not on file  Physical Activity: Not on file  Stress: Not on file  Social Connections: Not on file  Intimate Partner Violence: Not on file    Past Surgical History:  Procedure Laterality Date   KNEE ARTHROSCOPY W/ MENISCAL REPAIR     Left    Family History  Problem Relation Age of Onset   Diabetes Father    Hypertension Father    Arthritis Father    Heart disease Father    Breast cancer Maternal Grandmother     Allergies  Allergen Reactions   Amoxicillin Hives   Morphine Other (See Comments)    angry   Requip [Ropinirole Hcl] Other (See Comments)    Kept awake for 3 days    Current Outpatient Medications on File Prior to Visit  Medication Sig Dispense Refill   cephALEXin (KEFLEX) 500 MG capsule Take 1 capsule (500 mg total) by mouth 4 (four) times daily for 7 days. 28 capsule 0   doxycycline (VIBRAMYCIN) 50 MG capsule Take 50 mg by mouth 2 (two) times daily.     tirzepatide [facility]) 2.5 MG/0.5ML Pen Inject 2.5 mg into the skin once a week.     lisinopril-hydrochlorothiazide (ZESTORETIC) 20-12.5 MG tablet Take 1 tablet by mouth daily. 90 tablet 3   omeprazole (PRILOSEC) 20 MG capsule Take 1 capsule (20 mg total) by mouth daily. 90 capsule 3   phentermine 30 MG capsule Take 1 capsule (30 mg total) by mouth every morning. (Patient not taking: Reported on 12/29/2020) 30 capsule 0   sulfamethoxazole-trimethoprim (BACTRIM DS) 800-160 MG tablet Take 1 tablet by mouth 2 (two) times daily. (Patient not taking: Reported on 12/29/2020) 14 tablet 0    No current facility-administered medications on file prior to visit.    BP 110/72   Pulse 83   Temp 98.3 F (36.8 C) (Oral)   Ht 6' (1.829 m)   Wt (!) 446 lb (202.3 kg)   SpO2 98%   BMI 60.49 kg/m       Objective:   Physical Exam Vitals and nursing note reviewed.  Constitutional:      Appearance: Normal appearance.  Cardiovascular:     Rate and Rhythm: Normal rate and regular rhythm.     Pulses: Normal pulses.     Heart sounds: Normal heart sounds.  Pulmonary:     Effort: Pulmonary effort is normal.     Breath sounds: Normal breath sounds.  Musculoskeletal:        General: Normal range of motion.  Skin:    General: Skin is warm and dry.     Findings: Erythema present.     Comments: Cellulitis seems to have resolved.  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  Neurological:     General: No focal deficit present.     Mental Status: He is alert and oriented to person, place, and time.      Assessment & Plan:  1. Cellulitis of right leg -Finish antibiotics until gone  2. Acute superficial venous thrombosis of right lower extremity -Encouraged heating pad as much as possible as well as anti-inflammatory medication such as Aleve or Motrin.  Natural course of resolution reviewed with the patient.  He was advised to follow-up as needed  [name], NP

## 2021-01-05 ENCOUNTER — Ambulatory Visit: Payer: BC Managed Care – PPO | Admitting: Adult Health

## 2021-04-18 ENCOUNTER — Encounter: Payer: Self-pay | Admitting: Emergency Medicine

## 2021-04-18 ENCOUNTER — Ambulatory Visit
Admission: EM | Admit: 2021-04-18 | Discharge: 2021-04-18 | Disposition: A | Discharge status: Home / Self Care | Payer: BC Managed Care – PPO | Attending: Emergency Medicine | Admitting: Emergency Medicine

## 2021-04-18 ENCOUNTER — Other Ambulatory Visit: Payer: Self-pay

## 2021-04-18 DIAGNOSIS — B9789 Other viral agents as the cause of diseases classified elsewhere: Secondary | ICD-10-CM

## 2021-04-18 DIAGNOSIS — J069 Acute upper respiratory infection, unspecified: Secondary | ICD-10-CM

## 2021-04-18 DIAGNOSIS — I10 Essential (primary) hypertension: Secondary | ICD-10-CM

## 2021-04-18 DIAGNOSIS — J019 Acute sinusitis, unspecified: Secondary | ICD-10-CM

## 2021-04-18 LAB — POCT RAPID STREP A (OFFICE): Rapid Strep A Screen: NEGATIVE

## 2021-04-18 MED ORDER — BENZONATATE 100 MG PO CAPS: 100.0000 mg | ORAL_CAPSULE | Freq: Three times a day (TID) | ORAL | 0 refills | Status: DC | PRN

## 2021-04-18 MED ORDER — PREDNISONE 10 MG (21) PO TBPK: ORAL_TABLET | Freq: Every day | ORAL | 0 refills | Status: DC

## 2021-04-18 NOTE — ED Provider Notes (Signed)
?[facility] ? ? ? ?CSN: [id number] ?Arrival date & time: 04/18/21  1138 ? ? ?  ? ?History   ?Chief Complaint ?Chief Complaint  ?Patient presents with  ? Cough  ? Sinus Pressure  ? ? ?HPI ?Jordan Trujillo is a 41 y.o. male.  Patient presents with 2 to 3-day history of sinus congestion, sinus pressure, postnasal drip, runny nose, sore throat, productive cough.  He also reports low-grade fever of 99.8 yesterday.  No chest pain, shortness of breath, vomiting, diarrhea, or other symptoms.  Treatment at home with Mucinex.  Negative at-home COVID test x 2.  His medical history includes hypertension, morbid obesity, DVT, kidney stones, GERD.  Current every day smoker. ? ?The history is provided by the patient and medical records.  ? ?Past Medical History:  ?Diagnosis Date  ? Chicken pox   ? DVT (deep venous thrombosis) ([place])   ? GERD (gastroesophageal reflux disease)   ? Hypertension   ? Kidney stones   ? Morbid obesity ([place])   ? ? ?Patient Active Problem List  ? Diagnosis Date Noted  ? MORBID OBESITY 07/07/2009  ? ESSENTIAL HYPERTENSION, BENIGN 07/07/2009  ? ? ?Past Surgical History:  ?Procedure Laterality Date  ? KNEE ARTHROSCOPY W/ MENISCAL REPAIR    ? Left  ? ? ? ? ? ?Home Medications   ? ?Prior to Admission medications   ?Medication Sig Start Date End Date Taking? Authorizing Provider  ?benzonatate (TESSALON) 100 MG capsule Take 1 capsule (100 mg total) by mouth 3 (three) times daily as needed for cough. 04/18/21  Yes Sharion Balloon, NP  ?lisinopril-hydrochlorothiazide (ZESTORETIC) 20-12.5 MG tablet Take 1 tablet by mouth daily. 03/24/20 04/18/21 Yes Nafziger, Tommi Rumps, NP  ?omeprazole (PRILOSEC) 20 MG capsule Take 1 capsule (20 mg total) by mouth daily. 09/22/20 04/18/21 Yes Nafziger, Tommi Rumps, NP  ?predniSONE (STERAPRED UNI-PAK 21 TAB) 10 MG (21) TBPK tablet Take by mouth daily. As directed 04/18/21  Yes Sharion Balloon, NP  ?phentermine 30 MG capsule Take 1 capsule (30 mg total) by mouth every morning. ?Patient not  taking: Reported on 12/29/2020 11/03/20   [name], NP  ?tirzepatide [facility]) 2.5 MG/0.5ML Pen Inject 2.5 mg into the skin once a week.    [provider]  ? ? ?Family History ?Family History  ?Problem Relation Age of Onset  ? Diabetes Father   ? Hypertension Father   ? Arthritis Father   ? Heart disease Father   ? Breast cancer Maternal Grandmother   ? ? ?Social History ?Social History  ? ?Tobacco Use  ? Smoking status: Every Day  ?  Types: Cigarettes  ? Smokeless tobacco: Never  ?Vaping Use  ? Vaping Use: Never used  ?Substance Use Topics  ? Alcohol use: No  ? Drug use: No  ? ? ? ?Allergies   ?Amoxicillin, Morphine, and Requip [ropinirole hcl] ? ? ?Review of Systems ?Review of Systems  ?Constitutional:  Positive for fever. Negative for chills.  ?HENT:  Positive for congestion, postnasal drip, rhinorrhea, sinus pressure and sore throat. Negative for ear pain.   ?Respiratory:  Positive for cough. Negative for shortness of breath.   ?Cardiovascular:  Negative for chest pain and palpitations.  ?Gastrointestinal:  Negative for diarrhea and vomiting.  ?Skin:  Negative for color change and rash.  ?All other systems reviewed and are negative. ? ? ?Physical Exam ?Triage Vital Signs ?ED Triage Vitals  ?Enc Vitals Group  ?   BP 04/18/21 1147 (!) 185/99  ?  Pulse Rate 04/18/21 1147 (!) 105  ?   Resp 04/18/21 1147 20  ?   Temp 04/18/21 1147 98.4 ?F (36.9 ?C)  ?   Temp src --   ?   SpO2 04/18/21 1147 98 %  ?   Weight --   ?   Height --   ?   Head Circumference --   ?   Peak Flow --   ?   Pain Score 04/18/21 1149 0  ?   Pain Loc --   ?   Pain Edu? --   ?   Excl. in [place]? --   ? ?No data found. ? ?Updated Vital Signs ?BP (!) 154/92 (BP Location: Left Wrist)   Pulse (!) 105   Temp 98.4 ?F (36.9 ?C)   Resp 20   SpO2 98%  ? ?Visual Acuity ?Right Eye Distance:   ?Left Eye Distance:   ?Bilateral Distance:   ? ?Right Eye Near:   ?Left Eye Near:    ?Bilateral Near:    ? ?Physical Exam ?Vitals and nursing note  reviewed.  ?Constitutional:   ?   General: He is not in acute distress. ?   Appearance: He is well-developed. He is obese. He is not ill-appearing.  ?HENT:  ?   Right Ear: Tympanic membrane normal.  ?   Left Ear: Tympanic membrane normal.  ?   Nose: Congestion and rhinorrhea present.  ?   Mouth/Throat:  ?   Mouth: Mucous membranes are moist.  ?   Pharynx: Posterior oropharyngeal erythema present.  ?Cardiovascular:  ?   Rate and Rhythm: Normal rate and regular rhythm.  ?   Heart sounds: Normal heart sounds.  ?Pulmonary:  ?   Effort: Pulmonary effort is normal. No respiratory distress.  ?   Breath sounds: Normal breath sounds.  ?Musculoskeletal:  ?   Cervical back: Neck supple.  ?Skin: ?   General: Skin is warm and dry.  ?Neurological:  ?   Mental Status: He is alert.  ?Psychiatric:     ?   Mood and Affect: Mood normal.     ?   Behavior: Behavior normal.  ? ? ? ?UC Treatments / Results  ?Labs ?(all labs ordered are listed, but only abnormal results are displayed) ?Labs Reviewed  ?POCT RAPID STREP A (OFFICE)  ? ? ?EKG ? ? ?Radiology ?No results found. ? ?Procedures ?Procedures (including critical care time) ? ?Medications Ordered in UC ?Medications - No data to display ? ?Initial Impression / Assessment and Plan / UC Course  ?I have reviewed the triage vital signs and the nursing notes. ? ?Pertinent labs & imaging results that were available during my care of the patient were reviewed by me and considered in my medical decision making (see chart for details). ? ?  ?Viral URI with cough. Elevated blood pressure with hypertension.  Rapid strep negative. Patient declines COVID test.  Treating with prednisone taper and Tessalon Perles.  Instructed patient to follow-up with his PCP if his symptoms are not improving.  Also discussed with patient that his blood pressure is elevated today and needs to be rechecked by PCP in 2 to 4 weeks.  Education provided on managing hypertension.  He agrees to plan of care.  ? ?Final  Clinical Impressions(s) / UC Diagnoses  ? ?Final diagnoses:  ?Viral URI with cough  ?Elevated blood pressure reading in office with diagnosis of hypertension  ? ? ? ?Discharge Instructions   ? ?  ?Take the prednisone and  Tessalon Perles as directed. Follow up with your primary care provider if your symptoms are not improving.   ? ?Your blood pressure is elevated today at 185/99; repeat 154/92.  Please have this rechecked by your primary care provider in 2-4 weeks.     ? ? ? ? ? ?ED Prescriptions   ? ? Medication Sig Dispense Auth. Provider  ? predniSONE (STERAPRED UNI-PAK 21 TAB) 10 MG (21) TBPK tablet Take by mouth daily. As directed 21 tablet Sharion Balloon, NP  ? benzonatate (TESSALON) 100 MG capsule Take 1 capsule (100 mg total) by mouth 3 (three) times daily as needed for cough. 21 capsule Sharion Balloon, NP  ? ?  ? ?PDMP not reviewed this encounter. ?  ?Sharion Balloon, NP ?04/18/21 1229 ? ?

## 2021-04-18 NOTE — ED Triage Notes (Signed)
Pt c/o productive cough, sinus pressure and stuffy nose x 3 days  ?

## 2021-04-18 NOTE — Discharge Instructions (Addendum)
Take the prednisone and Tessalon Perles as directed. Follow up with your primary care provider if your symptoms are not improving.   ? ?Your blood pressure is elevated today at 185/99; repeat 154/92.  Please have this rechecked by your primary care provider in 2-4 weeks.     ? ?

## 2021-05-19 ENCOUNTER — Encounter: Payer: Self-pay | Admitting: Adult Health

## 2021-05-19 ENCOUNTER — Ambulatory Visit: Payer: BC Managed Care – PPO | Admitting: Adult Health

## 2021-05-19 VITALS — BP 160/120 | HR 96 | Temp 98.6°F | Ht 72.0 in | Wt >= 6400 oz

## 2021-05-19 DIAGNOSIS — I10 Essential (primary) hypertension: Secondary | ICD-10-CM

## 2021-05-19 DIAGNOSIS — M25562 Pain in left knee: Secondary | ICD-10-CM

## 2021-05-19 DIAGNOSIS — M25561 Pain in right knee: Secondary | ICD-10-CM

## 2021-05-19 DIAGNOSIS — G8929 Other chronic pain: Secondary | ICD-10-CM

## 2021-05-19 DIAGNOSIS — M545 Low back pain, unspecified: Secondary | ICD-10-CM | POA: Diagnosis not present

## 2021-05-19 MED ORDER — LISINOPRIL-HYDROCHLOROTHIAZIDE 20-12.5 MG PO TABS: 1.0000 | ORAL_TABLET | Freq: Every day | ORAL | 0 refills | Status: DC

## 2021-05-19 MED ORDER — CYCLOBENZAPRINE HCL 10 MG PO TABS: 10.0000 mg | ORAL_TABLET | Freq: Three times a day (TID) | ORAL | 0 refills | Status: DC | PRN

## 2021-05-19 NOTE — Progress Notes (Signed)
? ?Subjective:  ? ? Patient ID: Jordan Trujillo, male    DOB: 09/02/1980, 41 y.o.   MRN: [id number] ? ?HPI ? ?41 year old male who  has a past medical history of Chicken pox, DVT (deep venous thrombosis) ([place]), GERD (gastroesophageal reflux disease), Hypertension, Kidney stones, and Morbid obesity ([place]). ? ?He presents to the office today for chronic bilateral knee pain and new onset low back pain. ? ?He has history of right medial meniscus repair in March 2022.  Since that time he has had chronic knee pain in both knees but worse on the right.  Did not feel as though the surgery helped with his knee pain.  Sometimes has trouble walking.  Some days are better than others. ? ?Additionally, he was lifting heavy furniture at work and felt his low back tighten up last week.  Pain has been present since.  He will feel twinges in his back.  Pain is constant. ? ?Wt Readings from Last 3 Encounters:  ?05/19/21 (!) 478 lb (216.8 kg)  ?12/29/20 (!) 446 lb (202.3 kg)  ?12/22/20 (!) 445 lb (201.9 kg)  ? ?Additionally, he needs a refill of his blood pressure medication. ? ?Review of Systems ?See HPI  ? ?Past Medical History:  ?Diagnosis Date  ? Chicken pox   ? DVT (deep venous thrombosis) ([place])   ? GERD (gastroesophageal reflux disease)   ? Hypertension   ? Kidney stones   ? Morbid obesity ([place])   ? ? ?Social History  ? ?Socioeconomic History  ? Marital status: Married  ?  Spouse name: Not on file  ? Number of children: Not on file  ? Years of education: Not on file  ? Highest education level: Not on file  ?Occupational History  ? Not on file  ?Tobacco Use  ? Smoking status: Every Day  ?  Types: Cigarettes  ? Smokeless tobacco: Never  ?Vaping Use  ? Vaping Use: Never used  ?Substance and Sexual Activity  ? Alcohol use: No  ? Drug use: No  ? Sexual activity: Not on file  ?Other Topics Concern  ? Not on file  ?Social History Narrative  ? High School education   ? ?Social Determinants of Health  ? ?Financial Resource Strain: Not  on file  ?Food Insecurity: Not on file  ?Transportation Needs: Not on file  ?Physical Activity: Not on file  ?Stress: Not on file  ?Social Connections: Not on file  ?Intimate Partner Violence: Not on file  ? ? ?Past Surgical History:  ?Procedure Laterality Date  ? KNEE ARTHROSCOPY W/ MENISCAL REPAIR    ? Left  ? ? ?Family History  ?Problem Relation Age of Onset  ? Diabetes Father   ? Hypertension Father   ? Arthritis Father   ? Heart disease Father   ? Breast cancer Maternal Grandmother   ? ? ?Allergies  ?Allergen Reactions  ? Amoxicillin Hives  ? Morphine Other (See Comments)  ?  angry  ? Requip [Ropinirole Hcl] Other (See Comments)  ?  Kept awake for 3 days  ? ? ?Current Outpatient Medications on File Prior to Visit  ?Medication Sig Dispense Refill  ? lisinopril-hydrochlorothiazide (ZESTORETIC) 20-12.5 MG tablet Take 1 tablet by mouth daily. 90 tablet 3  ? omeprazole (PRILOSEC) 20 MG capsule Take 1 capsule (20 mg total) by mouth daily. 90 capsule 3  ? ?No current facility-administered medications on file prior to visit.  ? ? ?BP (!) 160/100   Pulse 96   Temp  98.6 ?F (37 ?C) (Oral)   Ht 6' (1.829 m)   Wt (!) 478 lb (216.8 kg)   SpO2 97%   BMI 64.83 kg/m?  ? ? ?   ?Objective:  ? Physical Exam ?Vitals and nursing note reviewed.  ?Constitutional:   ?   Appearance: Normal appearance. He is obese.  ?Musculoskeletal:     ?   General: Tenderness present. Normal range of motion.  ?   Right lower leg: No edema.  ?   Left lower leg: No edema.  ?Skin: ?   General: Skin is warm and dry.  ?   Capillary Refill: Capillary refill takes less than 2 seconds.  ?Neurological:  ?   General: No focal deficit present.  ?   Mental Status: He is alert and oriented to person, place, and time.  ?Psychiatric:     ?   Mood and Affect: Mood normal.     ?   Behavior: Behavior normal.     ?   Thought Content: Thought content normal.     ?   Judgment: Judgment normal.  ? ?   ?Assessment & Plan:  ?1. Chronic pain of right knee ? ?- DG Knee  1-2 Views Right; Future ?- AMB referral to orthopedics ? ?2. Chronic pain of left knee ? ?- DG Knee 1-2 Views Left; Future ?- AMB referral to orthopedics ? ?3. Acute bilateral low back pain without sciatica ?- appears muscular in origin. Will prescribe flexeril.  ?- cyclobenzaprine (FLEXERIL) 10 MG tablet; Take 1 tablet (10 mg total) by mouth 3 (three) times daily as needed for muscle spasms.  Dispense: 30 tablet; Refill: 0 ? ?4. Essential hypertension, benign ? ?- lisinopril-hydrochlorothiazide (ZESTORETIC) 20-12.5 MG tablet; Take 1 tablet by mouth daily.  Dispense: 90 tablet; Refill: 0 ? ?5. Morbid obesity ([place]) ?-He is looking into weight loss surgery.  Unfortunately his insurance does not cover any surgeries or medications when it comes to weight loss.  He is saving up so he can pay out-of-pocket.  Ideally this will be the best thing for his chronic joint pain. ? ?[name], NP ? ?

## 2021-05-30 ENCOUNTER — Telehealth: Payer: Self-pay | Admitting: Adult Health

## 2021-05-30 NOTE — Telephone Encounter (Signed)
Patient scheduled a physical in an Office Visit slot.  It was rescheduled in a physical time slot and the patient is very upset that he can be seen until the end of May.  He needs a 7:30 appointment.  He states he wants Tommi Rumps to call him, not anyone else.  He said to make sure that everyone knows that he DOES NOT want his nurse or anyone else to call him, only [place].  He wants to discuss this situation because he said he needs to be seen for something else that can't wait but wants a physical appt. ?

## 2021-05-31 NOTE — Telephone Encounter (Signed)
Message routed to PCP.

## 2021-06-01 ENCOUNTER — Encounter: Payer: Self-pay | Admitting: Adult Health

## 2021-06-01 ENCOUNTER — Telehealth: Payer: Self-pay | Admitting: Adult Health

## 2021-06-01 ENCOUNTER — Ambulatory Visit: Payer: BC Managed Care – PPO | Admitting: Adult Health

## 2021-06-01 VITALS — BP 140/90 | HR 85 | Temp 98.7°F | Ht 72.0 in | Wt >= 6400 oz

## 2021-06-01 DIAGNOSIS — R3589 Other polyuria: Secondary | ICD-10-CM

## 2021-06-01 DIAGNOSIS — R631 Polydipsia: Secondary | ICD-10-CM | POA: Diagnosis not present

## 2021-06-01 DIAGNOSIS — I10 Essential (primary) hypertension: Secondary | ICD-10-CM

## 2021-06-01 DIAGNOSIS — R7303 Prediabetes: Secondary | ICD-10-CM

## 2021-06-01 DIAGNOSIS — K2100 Gastro-esophageal reflux disease with esophagitis, without bleeding: Secondary | ICD-10-CM

## 2021-06-01 DIAGNOSIS — Z1159 Encounter for screening for other viral diseases: Secondary | ICD-10-CM

## 2021-06-01 DIAGNOSIS — E1169 Type 2 diabetes mellitus with other specified complication: Secondary | ICD-10-CM

## 2021-06-01 DIAGNOSIS — Z114 Encounter for screening for human immunodeficiency virus [HIV]: Secondary | ICD-10-CM

## 2021-06-01 DIAGNOSIS — Z0000 Encounter for general adult medical examination without abnormal findings: Secondary | ICD-10-CM

## 2021-06-01 LAB — CBC WITH DIFFERENTIAL/PLATELET
Basophils Absolute: 0.1 10*3/uL (ref 0.0–0.1)
Basophils Relative: 0.9 % (ref 0.0–3.0)
Eosinophils Absolute: 0.3 10*3/uL (ref 0.0–0.7)
Eosinophils Relative: 3 % (ref 0.0–5.0)
HCT: 41.5 % (ref 39.0–52.0)
Hemoglobin: 13.6 g/dL (ref 13.0–17.0)
Lymphocytes Relative: 27.1 % (ref 12.0–46.0)
Lymphs Abs: 2.3 10*3/uL (ref 0.7–4.0)
MCHC: 32.8 g/dL (ref 30.0–36.0)
MCV: 90.7 fl (ref 78.0–100.0)
Monocytes Absolute: 1 10*3/uL (ref 0.1–1.0)
Monocytes Relative: 11.6 % (ref 3.0–12.0)
Neutro Abs: 4.9 10*3/uL (ref 1.4–7.7)
Neutrophils Relative %: 57.4 % (ref 43.0–77.0)
Platelets: 261 10*3/uL (ref 150.0–400.0)
RBC: 4.58 Mil/uL (ref 4.22–5.81)
RDW: 14 % (ref 11.5–15.5)
WBC: 8.5 10*3/uL (ref 4.0–10.5)

## 2021-06-01 LAB — URINALYSIS
Bilirubin Urine: NEGATIVE
Hgb urine dipstick: NEGATIVE
Ketones, ur: NEGATIVE
Leukocytes,Ua: NEGATIVE
Nitrite: NEGATIVE
Specific Gravity, Urine: 1.02 (ref 1.000–1.030)
Total Protein, Urine: NEGATIVE
Urine Glucose: NEGATIVE
Urobilinogen, UA: 0.2 (ref 0.0–1.0)
pH: 6.5 (ref 5.0–8.0)

## 2021-06-01 LAB — LIPID PANEL
Cholesterol: 181 mg/dL (ref 0–200)
HDL: 54 mg/dL (ref >39.00)
LDL Cholesterol: 115 mg/dL — ABNORMAL HIGH (ref 0–99)
NonHDL: 127.29
Total CHOL/HDL Ratio: 3
Triglycerides: 62 mg/dL (ref 0.0–149.0)
VLDL: 12.4 mg/dL (ref 0.0–40.0)

## 2021-06-01 LAB — COMPREHENSIVE METABOLIC PANEL
ALT: 45 U/L (ref 0–53)
AST: 21 U/L (ref 0–37)
Albumin: 4.2 g/dL (ref 3.5–5.2)
Alkaline Phosphatase: 92 U/L (ref 39–117)
BUN: 21 mg/dL (ref 6–23)
CO2: 30 mEq/L (ref 19–32)
Calcium: 9.3 mg/dL (ref 8.4–10.5)
Chloride: 101 mEq/L (ref 96–112)
Creatinine, Ser: 0.85 mg/dL (ref 0.40–1.50)
GFR: 107.27 mL/min (ref >60.00)
Glucose, Bld: 141 mg/dL — ABNORMAL HIGH (ref 70–99)
Potassium: 4.4 mEq/L (ref 3.5–5.1)
Sodium: 137 mEq/L (ref 135–145)
Total Bilirubin: 0.3 mg/dL (ref 0.2–1.2)
Total Protein: 7.5 g/dL (ref 6.0–8.3)

## 2021-06-01 LAB — HEMOGLOBIN A1C: Hgb A1c MFr Bld: 6.7 % — ABNORMAL HIGH (ref 4.6–6.5)

## 2021-06-01 LAB — TSH: TSH: 1.48 u[IU]/mL (ref 0.35–5.50)

## 2021-06-01 MED ORDER — LISINOPRIL-HYDROCHLOROTHIAZIDE 20-12.5 MG PO TABS: 1.0000 | ORAL_TABLET | Freq: Every day | ORAL | 1 refills | Status: DC

## 2021-06-01 MED ORDER — METFORMIN HCL 500 MG PO TABS: 500.0000 mg | ORAL_TABLET | Freq: Every day | ORAL | 0 refills | Status: DC

## 2021-06-01 MED ORDER — TIRZEPATIDE 2.5 MG/0.5ML ~~LOC~~ SOAJ: 2.5000 mg | SUBCUTANEOUS | 0 refills | Status: DC

## 2021-06-01 NOTE — Progress Notes (Signed)
? ?Subjective:  ? ? Patient ID: Jordan Trujillo, male    DOB: January 12, 1981, 41 y.o.   MRN: [id number] ? ?HPI ?Patient presents for yearly preventative medicine examination. He is a 41 year old male who  has a past medical history of Chicken pox, DVT (deep venous thrombosis) ([place]), GERD (gastroesophageal reflux disease), Hypertension, Kidney stones, and Morbid obesity ([place]). ? ?HTN - Managed with Zestoretic 20-12.5 mg daily. Denies dizziness, lightheadedness, blurred vision, or headaches. ?BP Readings from Last 3 Encounters:  ?06/01/21 140/90  ?05/19/21 (!) 160/120  ?04/18/21 (!) 154/92  ? ?Pre diabetic -not currently on medication.  Does report more frequent urination and increased thirst.  ?Lab Results  ?Component Value Date  ? HGBA1C 6.3 03/24/2020  ? ?GERD - takes Prilosec 20 mg daily  ? ?Obesity -in the past has been trialed on phentermine and had good results with this medication.  Unfortunately once he stopped the medication his weight comes back.  He is looking into bariatric surgery but will likely have to pay out-of-pocket with his insurance plan. ?Wt Readings from Last 3 Encounters:  ?06/01/21 (!) 476 lb (215.9 kg)  ?05/19/21 (!) 478 lb (216.8 kg)  ?12/29/20 (!) 446 lb (202.3 kg)  ? ?All immunizations and health maintenance protocols were reviewed with the patient and needed orders were placed. ? ?Appropriate screening laboratory values were ordered for the patient including screening of hyperlipidemia, renal function and hepatic function. ? ?Medication reconciliation,  past medical history, social history, problem list and allergies were reviewed in detail with the patient ? ?Goals were established with regard to weight loss, exercise, and  diet in compliance with medications ? ?Review of Systems  ?Constitutional: Negative.   ?HENT: Negative.    ?Eyes: Negative.   ?Respiratory: Negative.    ?Cardiovascular:  Positive for leg swelling.  ?Gastrointestinal: Negative.   ?Endocrine: Positive for polydipsia and  polyuria.  ?Genitourinary:  Positive for frequency and urgency.  ?Musculoskeletal:  Positive for arthralgias.  ?Skin: Negative.   ?Allergic/Immunologic: Negative.   ?Neurological: Negative.   ?Hematological: Negative.   ?Psychiatric/Behavioral: Negative.    ?All other systems reviewed and are negative. ? ?Past Medical History:  ?Diagnosis Date  ? Chicken pox   ? DVT (deep venous thrombosis) ([place])   ? GERD (gastroesophageal reflux disease)   ? Hypertension   ? Kidney stones   ? Morbid obesity ([place])   ? ? ?Social History  ? ?Socioeconomic History  ? Marital status: Married  ?  Spouse name: Not on file  ? Number of children: Not on file  ? Years of education: Not on file  ? Highest education level: Not on file  ?Occupational History  ? Not on file  ?Tobacco Use  ? Smoking status: Every Day  ?  Types: Cigarettes  ? Smokeless tobacco: Never  ?Vaping Use  ? Vaping Use: Never used  ?Substance and Sexual Activity  ? Alcohol use: No  ? Drug use: No  ? Sexual activity: Not on file  ?Other Topics Concern  ? Not on file  ?Social History Narrative  ? High School education   ? ?Social Determinants of Health  ? ?Financial Resource Strain: Not on file  ?Food Insecurity: Not on file  ?Transportation Needs: Not on file  ?Physical Activity: Not on file  ?Stress: Not on file  ?Social Connections: Not on file  ?Intimate Partner Violence: Not on file  ? ? ?Past Surgical History:  ?Procedure Laterality Date  ? KNEE ARTHROSCOPY W/ MENISCAL REPAIR    ?  Left  ? ? ?Family History  ?Problem Relation Age of Onset  ? Diabetes Father   ? Hypertension Father   ? Arthritis Father   ? Heart disease Father   ? Breast cancer Maternal Grandmother   ? ? ?Allergies  ?Allergen Reactions  ? Amoxicillin Hives  ? Morphine Other (See Comments)  ?  angry  ? Requip [Ropinirole Hcl] Other (See Comments)  ?  Kept awake for 3 days  ? ? ?Current Outpatient Medications on File Prior to Visit  ?Medication Sig Dispense Refill  ? cyclobenzaprine (FLEXERIL) 10 MG tablet  Take 1 tablet (10 mg total) by mouth 3 (three) times daily as needed for muscle spasms. 30 tablet 0  ? lisinopril-hydrochlorothiazide (ZESTORETIC) 20-12.5 MG tablet Take 1 tablet by mouth daily. 90 tablet 0  ? omeprazole (PRILOSEC) 20 MG capsule Take 1 capsule (20 mg total) by mouth daily. 90 capsule 3  ? ?No current facility-administered medications on file prior to visit.  ? ? ?BP 140/90   Pulse 85   Temp 98.7 ?F (37.1 ?C) (Oral)   Ht 6' (1.829 m)   Wt (!) 476 lb (215.9 kg)   SpO2 99%   BMI 64.56 kg/m?  ? ? ?   ?Objective:  ? Physical Exam ?Vitals and nursing note reviewed.  ?Constitutional:   ?   General: He is not in acute distress. ?   Appearance: Normal appearance. He is well-developed. He is obese.  ?HENT:  ?   Head: Normocephalic and atraumatic.  ?   Right Ear: Tympanic membrane, ear canal and external ear normal. There is no impacted cerumen.  ?   Left Ear: Tympanic membrane, ear canal and external ear normal. There is no impacted cerumen.  ?   Nose: Nose normal. No congestion or rhinorrhea.  ?   Mouth/Throat:  ?   Mouth: Mucous membranes are moist.  ?   Pharynx: Oropharynx is clear. No oropharyngeal exudate or posterior oropharyngeal erythema.  ?Eyes:  ?   General:     ?   Right eye: No discharge.     ?   Left eye: No discharge.  ?   Extraocular Movements: Extraocular movements intact.  ?   Conjunctiva/sclera: Conjunctivae normal.  ?   Pupils: Pupils are equal, round, and reactive to light.  ?Neck:  ?   Vascular: No carotid bruit.  ?   Trachea: No tracheal deviation.  ?Cardiovascular:  ?   Rate and Rhythm: Normal rate and regular rhythm.  ?   Pulses: Normal pulses.  ?   Heart sounds: Normal heart sounds. No murmur heard. ?  No friction rub. No gallop.  ?Pulmonary:  ?   Effort: Pulmonary effort is normal. No respiratory distress.  ?   Breath sounds: Normal breath sounds. No stridor. No wheezing, rhonchi or rales.  ?Chest:  ?   Chest wall: No tenderness.  ?Abdominal:  ?   General: Bowel sounds are  normal. There is no distension.  ?   Palpations: Abdomen is soft. There is no mass.  ?   Tenderness: There is no abdominal tenderness. There is no right CVA tenderness, left CVA tenderness, guarding or rebound.  ?   Hernia: No hernia is present.  ?Musculoskeletal:     ?   General: No swelling, tenderness, deformity or signs of injury. Normal range of motion.  ?   Right lower leg: Edema present.  ?   Left lower leg: Edema present.  ?Lymphadenopathy:  ?   Cervical: No  cervical adenopathy.  ?Skin: ?   General: Skin is warm and dry.  ?   Capillary Refill: Capillary refill takes less than 2 seconds.  ?   Coloration: Skin is not jaundiced or pale.  ?   Findings: No bruising, erythema, lesion or rash.  ?Neurological:  ?   General: No focal deficit present.  ?   Mental Status: He is alert and oriented to person, place, and time.  ?   Cranial Nerves: No cranial nerve deficit.  ?   Sensory: No sensory deficit.  ?   Motor: No weakness.  ?   Coordination: Coordination normal.  ?   Gait: Gait normal.  ?   Deep Tendon Reflexes: Reflexes normal.  ?Psychiatric:     ?   Mood and Affect: Mood normal.     ?   Behavior: Behavior normal.     ?   Thought Content: Thought content normal.     ?   Judgment: Judgment normal.  ? ?   ?Assessment & Plan:  ?1. Routine general medical examination at a health care facility ?- Needs significant weight loss  ?- Follow up in one year or sooner if needed ?- CBC with Differential/Platelet; Future ?- Comprehensive metabolic panel; Future ?- Hemoglobin A1c; Future ?- Lipid panel; Future ?- TSH; Future ? ?2. Morbid obesity ([place]) ?- Saving up for weight loss surgery ?- Encouraged lifestyle modifications  ?- CBC with Differential/Platelet; Future ?- Comprehensive metabolic panel; Future ?- Hemoglobin A1c; Future ?- Lipid panel; Future ?- TSH; Future ? ?3. Essential hypertension, benign ?- Likely increase BP medication once labs are results  ?- CBC with Differential/Platelet; Future ?- Comprehensive  metabolic panel; Future ?- Hemoglobin A1c; Future ?- Lipid panel; Future ?- TSH; Future ?- Urinalysis; Future ? ?4. Pre-diabetes ?- Symptoms concerning for diabetes. Will check A1c today and treat as needed ?- CBC wi

## 2021-06-01 NOTE — Telephone Encounter (Signed)
Updated patient on his labs. His A1c is now 6.7 and he is considered diabetic  ? ?Will start him on Metformin 500 mg daily and Mounjaro 0.75 mg weekly.  ?

## 2021-06-02 LAB — HEPATITIS C ANTIBODY
Hepatitis C Ab: NONREACTIVE
SIGNAL TO CUT-OFF: 0.06 (ref <1.00)

## 2021-06-02 LAB — HIV ANTIBODY (ROUTINE TESTING W REFLEX): HIV 1&2 Ab, 4th Generation: NONREACTIVE

## 2021-06-08 ENCOUNTER — Ambulatory Visit (HOSPITAL_BASED_OUTPATIENT_CLINIC_OR_DEPARTMENT_OTHER): Payer: BC Managed Care – PPO | Admitting: Orthopaedic Surgery

## 2021-06-08 ENCOUNTER — Encounter: Payer: Self-pay | Admitting: Adult Health

## 2021-06-08 ENCOUNTER — Ambulatory Visit (INDEPENDENT_AMBULATORY_CARE_PROVIDER_SITE_OTHER): Payer: BC Managed Care – PPO

## 2021-06-08 ENCOUNTER — Ambulatory Visit: Payer: BC Managed Care – PPO | Admitting: Adult Health

## 2021-06-08 DIAGNOSIS — G8929 Other chronic pain: Secondary | ICD-10-CM

## 2021-06-08 DIAGNOSIS — M25561 Pain in right knee: Secondary | ICD-10-CM

## 2021-06-08 DIAGNOSIS — M25562 Pain in left knee: Secondary | ICD-10-CM | POA: Diagnosis not present

## 2021-06-08 MED ORDER — TRIAMCINOLONE ACETONIDE 40 MG/ML IJ SUSP
80.0000 mg | INTRAMUSCULAR | Status: AC | PRN
  Administered 2021-06-08: 80 mg via INTRA_ARTICULAR

## 2021-06-08 MED ORDER — LIDOCAINE HCL 1 % IJ SOLN
4.0000 mL | INTRAMUSCULAR | Status: AC | PRN
  Administered 2021-06-08: 4 mL

## 2021-06-08 NOTE — Telephone Encounter (Signed)
FYI

## 2021-06-08 NOTE — Progress Notes (Signed)
? ?                            ? ? ?Chief Complaint: Bilateral knee pain ?  ? ? ?History of Present Illness:  ? ? ?Jordan Trujillo is a 41 y.o. male presents with bilateral knee pain which has been ongoing for several years.  He states that with regard to the right knee he did have a previous meniscectomy done in March 2022 which gave him a few months relief.  His pain subsequently recurred.  With regard to the left knee he did have a procedure done 15 years prior which gave him nearly complete relief of his pain.  He states that he has been overusing the left as result of the right knee recovery denies experiencing pain on the side as well.  He does experience crepitus in both knees.  Here today for further assessment.  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 ? ? ? ?Surgical History:   ?Right knee arthroscopy with meniscal debridement medially done March 2022, unable to specify what was done on the left knee 15 years prior ? ?PMH/PSH/Family History/Social History/Meds/Allergies:   ? ?Past Medical History:  ?Diagnosis Date  ?? Chicken pox   ?? DVT (deep venous thrombosis) ([facility])   ?? GERD (gastroesophageal reflux disease)   ?? Hypertension   ?? Kidney stones   ?? Morbid obesity ([place])   ? ?Past Surgical History:  ?Procedure Laterality Date  ?? KNEE ARTHROSCOPY W/ MENISCAL REPAIR    ? Left  ? ?Social History  ? ?Socioeconomic History  ?? Marital status: Married  ?  Spouse name: Not on file  ?? Number of children: Not on file  ?? Years of education: Not on file  ?? Highest education level: Not on file  ?Occupational History  ?? Not on file  ?Tobacco Use  ?? Smoking status: Every Day  ?  Types: Cigarettes  ?? Smokeless tobacco: Never  ?Vaping Use  ?? Vaping Use: Never used  ?Substance and Sexual Activity  ?? Alcohol use: No  ?? Drug use: No  ?? Sexual activity: Not on file  ?Other Topics Concern  ?? Not on file  ?Social History Narrative  ? High School education   ? ?Social Determinants of Health  ? ?Financial Resource  Strain: Not on file  ?Food Insecurity: Not on file  ?Transportation Needs: Not on file  ?Physical Activity: Not on file  ?Stress: Not on file  ?Social Connections: Not on file  ? ?Family History  ?Problem Relation Age of Onset  ?? Diabetes Father   ?? Hypertension Father   ?? Arthritis Father   ?? Heart disease Father   ?? Breast cancer Maternal Grandmother   ? ?Allergies  ?Allergen Reactions  ?? Amoxicillin Hives  ?? Morphine Other (See Comments)  ?  angry  ?? Requip [Ropinirole Hcl] Other (See Comments)  ?  Kept awake for 3 days  ? ?Current Outpatient Medications  ?Medication Sig Dispense Refill  ?? cyclobenzaprine (FLEXERIL) 10 MG tablet Take 1 tablet (10 mg total) by mouth 3 (three) times daily as needed for muscle spasms. 30 tablet 0  ?? lisinopril-hydrochlorothiazide (ZESTORETIC) 20-12.5 MG tablet Take 1 tablet by mouth daily. 90 tablet 1  ?? metFORMIN (GLUCOPHAGE) 500 MG tablet Take 1 tablet (500 mg total) by mouth daily with breakfast. 90 tablet 0  ?? omeprazole (PRILOSEC) 20 MG capsule Take 1 capsule (20 mg total) by mouth daily. 90 capsule 3  ??  tirzepatide [facility]) 2.5 MG/0.5ML Pen Inject 2.5 mg into the skin once a week. 2 mL 0  ? ?No current facility-administered medications for this visit.  ? ?No results found. ? ?Review of Systems:   ?A ROS was performed including pertinent positives and negatives as documented in the HPI. ? ?Physical Exam :   ?Constitutional: NAD and appears stated age ?Neurological: Alert and oriented ?Psych: Appropriate affect and cooperative ?There were no vitals taken for this visit.  ? ?Comprehensive Musculoskeletal Exam:   ? ?Bilateral knees with tenderness to palpation over the medial lateral joint space as well as patellofemoral joint.  Range of motion is from 0 to 130 degrees.  There is positive crepitus bilaterally. ? ?Imaging:   ?Xray (4 views right knee, 4 views left knee): ?Mild medial joint space narrowing of both knees ? ?I personally reviewed and interpreted the  radiographs. ? ? ?Assessment:   ?41 y.o. male bilateral mild medial joint space narrowing of both knees.  Given the fact that he has not done well with meniscal debridement, I do not necessarily believe that additional meniscal procedures would be of benefit for him.  To this effect he is hoping to get some type of relief.  I have recommended bilateral ultrasound-guided knee injections at today's visit to hopefully get him some form of relief.  This will hopefully allow him to be more active and to work on additional weight loss which she has been working on. ? ?Plan :   ? ?-Bilateral ultrasound-guided injections of both knees performed today after verbal consent obtained ? ? ? ? ?Procedure Note ? ?Patient: Jordan Trujillo             ?Date of Birth: [birth year]           ?MRN: [id number]             ?Visit Date: 06/08/2021 ? ?Procedures: ?Visit Diagnoses:  ?1. Chronic pain of both knees   ? ? ?Large Joint Inj: L knee on 06/08/2021 3:33 PM ?Indications: pain ?Details: 22 G 1.5 in needle, ultrasound-guided anterior approach ? ?Arthrogram: No ? ?Medications: 4 mL lidocaine 1 %; 80 mg triamcinolone acetonide 40 MG/ML ?Outcome: tolerated well, no immediate complications ?Procedure, treatment alternatives, risks and benefits explained, specific risks discussed. Consent was given by the patient. Immediately prior to procedure a time out was called to verify the correct patient, procedure, equipment, support staff and site/side marked as required. Patient was prepped and draped in the usual sterile fashion.  ? ? ?Large Joint Inj: R knee on 06/08/2021 3:33 PM ?Indications: pain ?Details: 22 G 1.5 in needle, ultrasound-guided anterior approach ? ?Arthrogram: No ? ?Medications: 4 mL lidocaine 1 %; 80 mg triamcinolone acetonide 40 MG/ML ?Outcome: tolerated well, no immediate complications ?Procedure, treatment alternatives, risks and benefits explained, specific risks discussed. Consent was given by the patient. Immediately  prior to procedure a time out was called to verify the correct patient, procedure, equipment, support staff and site/side marked as required. Patient was prepped and draped in the usual sterile fashion.  ? ? ? ? ? ? ? ? ?I personally saw and evaluated the patient, and participated in the management and treatment plan. ? ?Vanetta Mulders, MD ?Attending Physician, Orthopedic Surgery ? ?This document was dictated using Systems analyst. A reasonable attempt at proof reading has been made to minimize errors. ?

## 2021-06-08 NOTE — Telephone Encounter (Signed)
Noted  

## 2021-06-14 ENCOUNTER — Other Ambulatory Visit: Payer: Self-pay | Admitting: Adult Health

## 2021-06-14 NOTE — Telephone Encounter (Signed)
Please advise look like you were waiting on labs to return before increasing medication.  ?

## 2021-06-15 ENCOUNTER — Other Ambulatory Visit: Payer: Self-pay | Admitting: Adult Health

## 2021-06-15 MED ORDER — HYDROCHLOROTHIAZIDE 25 MG PO TABS: 25.0000 mg | ORAL_TABLET | Freq: Every day | ORAL | 1 refills | Status: DC

## 2021-06-15 MED ORDER — LISINOPRIL 40 MG PO TABS: 40.0000 mg | ORAL_TABLET | Freq: Every day | ORAL | 1 refills | Status: DC

## 2021-06-22 ENCOUNTER — Other Ambulatory Visit: Payer: Self-pay | Admitting: Adult Health

## 2021-06-22 MED ORDER — TIRZEPATIDE 5 MG/0.5ML ~~LOC~~ SOAJ: 5.0000 mg | SUBCUTANEOUS | 0 refills | Status: DC

## 2021-06-22 MED ORDER — POTASSIUM CHLORIDE CRYS ER 20 MEQ PO TBCR: 20.0000 meq | EXTENDED_RELEASE_TABLET | Freq: Every day | ORAL | 1 refills | Status: DC

## 2021-06-29 ENCOUNTER — Encounter: Payer: BC Managed Care – PPO | Admitting: Adult Health

## 2021-07-06 ENCOUNTER — Ambulatory Visit: Payer: BC Managed Care – PPO | Admitting: Adult Health

## 2021-07-06 ENCOUNTER — Other Ambulatory Visit: Payer: Self-pay | Admitting: Adult Health

## 2021-07-06 ENCOUNTER — Encounter: Payer: Self-pay | Admitting: Adult Health

## 2021-07-06 VITALS — BP 120/84 | HR 82 | Temp 98.5°F | Ht 72.0 in | Wt >= 6400 oz

## 2021-07-06 DIAGNOSIS — R252 Cramp and spasm: Secondary | ICD-10-CM | POA: Diagnosis not present

## 2021-07-06 LAB — BASIC METABOLIC PANEL
BUN: 22 mg/dL (ref 6–23)
CO2: 28 mEq/L (ref 19–32)
Calcium: 9.6 mg/dL (ref 8.4–10.5)
Chloride: 100 mEq/L (ref 96–112)
Creatinine, Ser: 0.96 mg/dL (ref 0.40–1.50)
GFR: 97.51 mL/min (ref >60.00)
Glucose, Bld: 89 mg/dL (ref 70–99)
Potassium: 4.1 mEq/L (ref 3.5–5.1)
Sodium: 137 mEq/L (ref 135–145)

## 2021-07-06 LAB — MAGNESIUM: Magnesium: 2.1 mg/dL (ref 1.5–2.5)

## 2021-07-06 MED ORDER — POTASSIUM CHLORIDE CRYS ER 20 MEQ PO TBCR: 20.0000 meq | EXTENDED_RELEASE_TABLET | Freq: Two times a day (BID) | ORAL | 1 refills | Status: DC

## 2021-07-06 NOTE — Progress Notes (Signed)
Subjective:    Patient ID: Jordan Trujillo, male    DOB: [birth year], 41 y.o.   MRN: [id number]  HPI 41 year old male who  has a past medical history of Chicken pox, DVT (deep venous thrombosis) ([place]), GERD (gastroesophageal reflux disease), Hypertension, Kidney stones, and Morbid obesity ([place]).  He presents to the office today for acute cramping in his hands and legs.  Most recently roughly 1 month ago we increased his blood pressure medication from lisinopril 20 mg/HCTZ 12.5 mg to HCTZ 25 mg and lisinopril 40 mg daily.  Since the increase he developed cramping in his hands, feet, and lower legs.  He was prescribed potassium chloride 20 mEq daily and reports that this helped in the morning and during the day but in the evening the cramping returns and wakes him from sleep  BP Readings from Last 3 Encounters:  07/06/21 120/84  06/01/21 140/90  05/19/21 (!) 160/120     Review of Systems See HPI   Past Medical History:  Diagnosis Date   Chicken pox    DVT (deep venous thrombosis) (HCC)    GERD (gastroesophageal reflux disease)    Hypertension    Kidney stones    Morbid obesity ([place])     Social History   Socioeconomic History   Marital status: Married    Spouse name: Not on file   Number of children: Not on file   Years of education: Not on file   Highest education level: Not on file  Occupational History   Not on file  Tobacco Use   Smoking status: Every Day    Types: Cigarettes   Smokeless tobacco: Never  Vaping Use   Vaping Use: Never used  Substance and Sexual Activity   Alcohol use: No   Drug use: No   Sexual activity: Not on file  Other Topics Concern   Not on file  Social History Narrative   High School education    Social Determinants of Health   Financial Resource Strain: Not on file  Food Insecurity: Not on file  Transportation Needs: Not on file  Physical Activity: Not on file  Stress: Not on file  Social Connections: Not on file  Intimate  Partner Violence: Not on file    Past Surgical History:  Procedure Laterality Date   KNEE ARTHROSCOPY W/ MENISCAL REPAIR     Left    Family History  Problem Relation Age of Onset   Diabetes Father    Hypertension Father    Arthritis Father    Heart disease Father    Breast cancer Maternal Grandmother     Allergies  Allergen Reactions   Amoxicillin Hives   Morphine Other (See Comments)    angry   Requip [Ropinirole Hcl] Other (See Comments)    Kept awake for 3 days    Current Outpatient Medications on File Prior to Visit  Medication Sig Dispense Refill   cyclobenzaprine (FLEXERIL) 10 MG tablet Take 1 tablet (10 mg total) by mouth 3 (three) times daily as needed for muscle spasms. 30 tablet 0   doxycycline (PERIOSTAT) 20 MG tablet Take 20 mg by mouth 2 (two) times daily.     hydrochlorothiazide (HYDRODIURIL) 25 MG tablet Take 1 tablet (25 mg total) by mouth daily. 90 tablet 1   lisinopril (ZESTRIL) 40 MG tablet Take 1 tablet (40 mg total) by mouth daily. 90 tablet 1   metFORMIN (GLUCOPHAGE) 500 MG tablet Take 1 tablet (500 mg total) by mouth daily  with breakfast. 90 tablet 0   omeprazole (PRILOSEC) 20 MG capsule Take 1 capsule (20 mg total) by mouth daily. 90 capsule 3   potassium chloride SA (KLOR-CON M) 20 MEQ tablet Take 1 tablet (20 mEq total) by mouth daily. 90 tablet 1   tirzepatide (MOUNJARO) 5 MG/0.5ML Pen Inject 5 mg into the skin once a week. 2 mL 0   No current facility-administered medications on file prior to visit.    BP 120/84   Pulse 82   Temp 98.5 F (36.9 C) (Oral)   Ht 6' (1.829 m)   Wt (!) 452 lb (205 kg)   SpO2 100%   BMI 61.30 kg/m       Objective:   Physical Exam Vitals and nursing note reviewed.  Constitutional:      Appearance: Normal appearance. He is obese.  Cardiovascular:     Rate and Rhythm: Normal rate and regular rhythm.     Pulses: Normal pulses.     Heart sounds: Normal heart sounds.  Pulmonary:     Effort: Pulmonary  effort is normal.     Breath sounds: Normal breath sounds.  Skin:    General: Skin is warm and dry.  Neurological:     General: No focal deficit present.     Mental Status: He is alert and oriented to person, place, and time.  Psychiatric:        Mood and Affect: Mood normal.        Behavior: Behavior normal.        Thought Content: Thought content normal.       Assessment & Plan:  1. Cramping of feet - likely need to add potassium supplement at evening  - Basic Metabolic Panel; Future - Magnesium; Future  2. Cramping of hands  - Basic Metabolic Panel; Future - Magnesium; Future  [name], NP

## 2021-07-07 ENCOUNTER — Other Ambulatory Visit: Payer: Self-pay | Admitting: Adult Health

## 2021-07-07 DIAGNOSIS — E1169 Type 2 diabetes mellitus with other specified complication: Secondary | ICD-10-CM

## 2021-07-07 MED ORDER — BLOOD GLUCOSE MONITOR KIT: PACK | 0 refills | Status: AC

## 2021-07-07 MED ORDER — FREESTYLE LIBRE 3 SENSOR MISC: 1.0000 | 3 refills | Status: DC

## 2021-08-04 ENCOUNTER — Other Ambulatory Visit: Payer: Self-pay | Admitting: Adult Health

## 2021-08-04 MED ORDER — TIRZEPATIDE 7.5 MG/0.5ML ~~LOC~~ SOAJ: 7.5000 mg | SUBCUTANEOUS | 0 refills | Status: DC

## 2021-08-30 ENCOUNTER — Other Ambulatory Visit: Payer: Self-pay

## 2021-08-30 ENCOUNTER — Other Ambulatory Visit: Payer: Self-pay | Admitting: Adult Health

## 2021-08-30 DIAGNOSIS — K2100 Gastro-esophageal reflux disease with esophagitis, without bleeding: Secondary | ICD-10-CM

## 2021-08-30 MED ORDER — OMEPRAZOLE 20 MG PO CPDR: 20.0000 mg | DELAYED_RELEASE_CAPSULE | Freq: Every day | ORAL | 1 refills | Status: DC

## 2021-08-30 MED ORDER — TIRZEPATIDE 10 MG/0.5ML ~~LOC~~ SOAJ: 10.0000 mg | SUBCUTANEOUS | 0 refills | Status: DC

## 2021-09-07 ENCOUNTER — Other Ambulatory Visit: Payer: Self-pay | Admitting: Adult Health

## 2021-09-07 ENCOUNTER — Encounter: Payer: Self-pay | Admitting: Adult Health

## 2021-09-07 ENCOUNTER — Ambulatory Visit: Payer: BC Managed Care – PPO | Admitting: Adult Health

## 2021-09-07 VITALS — BP 120/58 | HR 85 | Temp 98.0°F | Ht 72.0 in | Wt >= 6400 oz

## 2021-09-07 DIAGNOSIS — I10 Essential (primary) hypertension: Secondary | ICD-10-CM

## 2021-09-07 DIAGNOSIS — E1169 Type 2 diabetes mellitus with other specified complication: Secondary | ICD-10-CM

## 2021-09-07 DIAGNOSIS — M5441 Lumbago with sciatica, right side: Secondary | ICD-10-CM

## 2021-09-07 DIAGNOSIS — M5442 Lumbago with sciatica, left side: Secondary | ICD-10-CM

## 2021-09-07 LAB — POCT GLYCOSYLATED HEMOGLOBIN (HGB A1C)

## 2021-09-07 MED ORDER — METHYLPREDNISOLONE 4 MG PO TBPK: ORAL_TABLET | ORAL | 0 refills | Status: DC

## 2021-09-07 NOTE — Progress Notes (Unsigned)
Subjective:    Patient ID: Jordan Trujillo, male    DOB: [birth year], 41 y.o.   MRN: [id number]  HPI 41 year old male who  has a past medical history of Chicken pox, DVT (deep venous thrombosis) ([place]), GERD (gastroesophageal reflux disease), Hypertension, Kidney stones, and Morbid obesity ([place]).  He presents to the office today for follow-up regarding hypertension and diabetes.  Hypertension-managed with Zestoretic 20-12.5 mg daily.  He denies dizziness, lightheadedness, blurred vision, or headaches  Diabetes mellitus type 2-diagnosed in May 2023 at his CPE when his A1c was 6.7.  We first trialed him on metformin 500 mg daily but this caused GI issues, we discontinued this medication and switched him to [facility] which he has been doing well with.  He has had some mild acid reflux but other than that he is tolerating the medication well.  We have been able to titrate him up to 10 mg.  He is noticing that his appetite has diminished, he is eating less, and has less cravings.  Lab Results  Component Value Date   HGBA1C 6.7 (H) 06/01/2021   Wt Readings from Last 3 Encounters:  07/06/21 (!) 452 lb (205 kg)  06/01/21 (!) 476 lb (215.9 kg)  05/19/21 (!) 478 lb (216.8 kg)   Review of Systems See HPI   Past Medical History:  Diagnosis Date   Chicken pox    DVT (deep venous thrombosis) (HCC)    GERD (gastroesophageal reflux disease)    Hypertension    Kidney stones    Morbid obesity ([place])     Social History   Socioeconomic History   Marital status: Married    Spouse name: Not on file   Number of children: Not on file   Years of education: Not on file   Highest education level: Not on file  Occupational History   Not on file  Tobacco Use   Smoking status: Every Day    Types: Cigarettes   Smokeless tobacco: Never  Vaping Use   Vaping Use: Never used  Substance and Sexual Activity   Alcohol use: No   Drug use: No   Sexual activity: Not on file  Other Topics Concern    Not on file  Social History Narrative   High School education    Social Determinants of Health   Financial Resource Strain: Not on file  Food Insecurity: Not on file  Transportation Needs: Not on file  Physical Activity: Not on file  Stress: Not on file  Social Connections: Not on file  Intimate Partner Violence: Not on file    Past Surgical History:  Procedure Laterality Date   KNEE ARTHROSCOPY W/ MENISCAL REPAIR     Left    Family History  Problem Relation Age of Onset   Diabetes Father    Hypertension Father    Arthritis Father    Heart disease Father    Breast cancer Maternal Grandmother     Allergies  Allergen Reactions   Amoxicillin Hives   Morphine Other (See Comments)    angry   Requip [Ropinirole Hcl] Other (See Comments)    Kept awake for 3 days    Current Outpatient Medications on File Prior to Visit  Medication Sig Dispense Refill   blood glucose meter kit and supplies KIT Dispense based on patient and insurance preference. Use up to four times daily as directed. 1 each 0   cyclobenzaprine (FLEXERIL) 10 MG tablet Take 1 tablet (10 mg total) by mouth 3 (  three) times daily as needed for muscle spasms. 30 tablet 0   hydrochlorothiazide (HYDRODIURIL) 25 MG tablet Take 1 tablet (25 mg total) by mouth daily. 90 tablet 1   lisinopril (ZESTRIL) 40 MG tablet Take 1 tablet (40 mg total) by mouth daily. 90 tablet 1   omeprazole (PRILOSEC) 20 MG capsule Take 1 capsule (20 mg total) by mouth daily. 90 capsule 1   potassium chloride SA (KLOR-CON M) 20 MEQ tablet Take 1 tablet (20 mEq total) by mouth 2 (two) times daily. 180 tablet 1   tirzepatide (MOUNJARO) 10 MG/0.5ML Pen Inject 10 mg into the skin once a week. 6 mL 0   No current facility-administered medications on file prior to visit.    There were no vitals taken for this visit.      Objective:   Physical Exam Vitals and nursing note reviewed.  Constitutional:      Appearance: Normal appearance.   Cardiovascular:     Rate and Rhythm: Normal rate and regular rhythm.     Pulses: Normal pulses.     Heart sounds: Normal heart sounds.  Pulmonary:     Effort: Pulmonary effort is normal.     Breath sounds: Normal breath sounds.  Musculoskeletal:        General: Normal range of motion.  Skin:    General: Skin is warm and dry.  Neurological:     General: No focal deficit present.     Mental Status: He is alert and oriented to person, place, and time.  Psychiatric:        Mood and Affect: Mood normal.        Behavior: Behavior normal.        Thought Content: Thought content normal.        Judgment: Judgment normal.       Assessment & Plan:

## 2021-09-07 NOTE — Progress Notes (Signed)
Subjective:    Patient ID: Jordan Trujillo, male    DOB: 05-Oct-1980, 41 y.o.   MRN: [id number]  HPI 41 year old male who  has a past medical history of Chicken pox, DVT (deep venous thrombosis) ([place]), GERD (gastroesophageal reflux disease), Hypertension, Kidney stones, and Morbid obesity ([place]).  He presents to the office today for three month follow up regarding hypertnension and diabetes   HTN - managed with Zestoretic 20-12.5 mg daily. He denies dizziness, lightheadedness, blurred vision or headaches.  Diabetes Mellitus type 2 - diagnosed in May 2023 at his CPE when his A1c was 6.7. We first trialed him on metformin 500 mg daily but this caused GI issues, we then discontinued this medication and switched him to [facility] which he has been doing well with. He had had some mild acid reflux but other then that he has no issues. We have been able to titrate him up to 10 mg weekly. He has noticed that his appetite has diminished, he is eating less, and having less craving. He has been eating a lot more fruits and vegetables and less red meats. He is staying active at work   [employer] from Last 3 Encounters:  09/07/21 (!) 425 lb (192.8 kg)  07/06/21 (!) 452 lb (205 kg)  06/01/21 (!) 476 lb (215.9 kg)   He also reports acute low back pain with a numbness and tingling sensation in his thighs. This is more apparent when standing for long periods of time, sitting, and laying in bed. He has noticed that when he stretches it helps alleviate his symptoms. No issues with bowel or bladder    Review of Systems See HPI   Past Medical History:  Diagnosis Date   Chicken pox    DVT (deep venous thrombosis) (HCC)    GERD (gastroesophageal reflux disease)    Hypertension    Kidney stones    Morbid obesity (HCC)     Social History   Socioeconomic History   Marital status: Married    Spouse name: Not on file   Number of children: Not on file   Years of education: Not on file   Highest  education level: Not on file  Occupational History   Not on file  Tobacco Use   Smoking status: Every Day    Types: Cigarettes   Smokeless tobacco: Never  Vaping Use   Vaping Use: Never used  Substance and Sexual Activity   Alcohol use: No   Drug use: No   Sexual activity: Not on file  Other Topics Concern   Not on file  Social History Narrative   High School education    Social Determinants of Health   Financial Resource Strain: Not on file  Food Insecurity: Not on file  Transportation Needs: Not on file  Physical Activity: Not on file  Stress: Not on file  Social Connections: Not on file  Intimate Partner Violence: Not on file    Past Surgical History:  Procedure Laterality Date   KNEE ARTHROSCOPY W/ MENISCAL REPAIR     Left    Family History  Problem Relation Age of Onset   Diabetes Father    Hypertension Father    Arthritis Father    Heart disease Father    Breast cancer Maternal Grandmother     Allergies  Allergen Reactions   Amoxicillin Hives   Morphine Other (See Comments)    angry   Requip [Ropinirole Hcl] Other (See Comments)    Kept  awake for 3 days    Current Outpatient Medications on File Prior to Visit  Medication Sig Dispense Refill   blood glucose meter kit and supplies KIT Dispense based on patient and insurance preference. Use up to four times daily as directed. 1 each 0   cyclobenzaprine (FLEXERIL) 10 MG tablet Take 1 tablet (10 mg total) by mouth 3 (three) times daily as needed for muscle spasms. 30 tablet 0   hydrochlorothiazide (HYDRODIURIL) 25 MG tablet Take 1 tablet (25 mg total) by mouth daily. 90 tablet 1   lisinopril (ZESTRIL) 40 MG tablet Take 1 tablet (40 mg total) by mouth daily. 90 tablet 1   omeprazole (PRILOSEC) 20 MG capsule Take 1 capsule (20 mg total) by mouth daily. 90 capsule 1   potassium chloride SA (KLOR-CON M) 20 MEQ tablet Take 1 tablet (20 mEq total) by mouth 2 (two) times daily. 180 tablet 1   tirzepatide  (MOUNJARO) 10 MG/0.5ML Pen Inject 10 mg into the skin once a week. 6 mL 0   ACCU-CHEK GUIDE test strip USE 1 STRIP TO CHECK GLUCOSE UP TO 4 TIMES DAILY AS DIRECTED     Accu-Chek Softclix Lancets lancets SMARTSIG:1 Topical 1-4 Times Daily     No current facility-administered medications on file prior to visit.    BP (!) 120/58   Pulse 85   Temp 98 F (36.7 C) (Oral)   Ht 6' (1.829 m)   Wt (!) 425 lb (192.8 kg)   SpO2 98%   BMI 57.64 kg/m       Objective:   Physical Exam Vitals and nursing note reviewed.  Constitutional:      Appearance: Normal appearance. He is obese.  Cardiovascular:     Rate and Rhythm: Normal rate and regular rhythm.     Pulses: Normal pulses.     Heart sounds: Normal heart sounds.  Pulmonary:     Effort: Pulmonary effort is normal.     Breath sounds: Normal breath sounds.  Musculoskeletal:        General: Normal range of motion.  Skin:    General: Skin is warm and dry.  Neurological:     General: No focal deficit present.     Mental Status: He is alert and oriented to person, place, and time.  Psychiatric:        Mood and Affect: Mood normal.        Behavior: Behavior normal.        Thought Content: Thought content normal.        Judgment: Judgment normal.       Assessment & Plan:  1. Type 2 diabetes mellitus with other specified complication, without long-term current use of insulin (HCC)  - POC HgB A1c- 5.4  - Will keep him on Mounajro 10 mg weekly - Follow up in 3 months   2. Essential hypertension, benign - Well controlled. No change in medication   3. MORBID OBESITY - He is down 51 pounds since starting on mounjaro   4. Acute bilateral low back pain with bilateral sciatica  - methylPREDNISolone (MEDROL DOSEPAK) 4 MG TBPK tablet; Take as directed  Dispense: 21 tablet; Refill: 0  [name], NP

## 2021-10-12 ENCOUNTER — Encounter: Payer: Self-pay | Admitting: Adult Health

## 2021-10-13 NOTE — Telephone Encounter (Signed)
FYI

## 2021-10-26 ENCOUNTER — Encounter: Payer: Self-pay | Admitting: Adult Health

## 2021-10-26 ENCOUNTER — Ambulatory Visit: Payer: BC Managed Care – PPO | Admitting: Adult Health

## 2021-10-26 VITALS — BP 120/84 | HR 75 | Temp 98.1°F | Ht 72.0 in | Wt >= 6400 oz

## 2021-10-26 DIAGNOSIS — F900 Attention-deficit hyperactivity disorder, predominantly inattentive type: Secondary | ICD-10-CM

## 2021-10-26 NOTE — Patient Instructions (Addendum)
[facility]  Address: [address], [place], [place] [address] Phone: [phone]

## 2021-10-26 NOTE — Progress Notes (Signed)
Subjective:    Patient ID: Jordan Trujillo, male    DOB: [birth year], 41 y.o.   MRN: [id number]  HPI 41 year old male who  has a past medical history of Chicken pox, DVT (deep venous thrombosis) ([place]), GERD (gastroesophageal reflux disease), Hypertension, Kidney stones, and Morbid obesity ([place]).  He presents to the office today for concern of ADHD.  He reports that his therapist advised him to reach out to his primary care provider to discuss ADHD.  He reports that he will often have 6 tasks going on at the same time at work and is unable to complete in a timely manner.  He often gets sidetracked at work and at home when he has a test to complete.  He reports that it has been like this all of his life and he thought it was normal.  Review of Systems See HPI   Past Medical History:  Diagnosis Date   Chicken pox    DVT (deep venous thrombosis) (HCC)    GERD (gastroesophageal reflux disease)    Hypertension    Kidney stones    Morbid obesity ([place])     Social History   Socioeconomic History   Marital status: Married    Spouse name: Not on file   Number of children: Not on file   Years of education: Not on file   Highest education level: Not on file  Occupational History   Not on file  Tobacco Use   Smoking status: Every Day    Types: Cigarettes   Smokeless tobacco: Never  Vaping Use   Vaping Use: Never used  Substance and Sexual Activity   Alcohol use: No   Drug use: No   Sexual activity: Not on file  Other Topics Concern   Not on file  Social History Narrative   High School education    Social Determinants of Health   Financial Resource Strain: Not on file  Food Insecurity: Not on file  Transportation Needs: Not on file  Physical Activity: Not on file  Stress: Not on file  Social Connections: Not on file  Intimate Partner Violence: Not on file    Past Surgical History:  Procedure Laterality Date   KNEE ARTHROSCOPY W/ MENISCAL REPAIR     Left    Family  History  Problem Relation Age of Onset   Diabetes Father    Hypertension Father    Arthritis Father    Heart disease Father    Breast cancer Maternal Grandmother     Allergies  Allergen Reactions   Amoxicillin Hives   Morphine Other (See Comments)    angry   Requip [Ropinirole Hcl] Other (See Comments)    Kept awake for 3 days    Current Outpatient Medications on File Prior to Visit  Medication Sig Dispense Refill   ACCU-CHEK GUIDE test strip USE 1 STRIP TO CHECK GLUCOSE UP TO 4 TIMES DAILY AS DIRECTED     Accu-Chek Softclix Lancets lancets SMARTSIG:1 Topical 1-4 Times Daily     blood glucose meter kit and supplies KIT Dispense based on patient and insurance preference. Use up to four times daily as directed. 1 each 0   cyclobenzaprine (FLEXERIL) 10 MG tablet Take 1 tablet (10 mg total) by mouth 3 (three) times daily as needed for muscle spasms. 30 tablet 0   hydrochlorothiazide (HYDRODIURIL) 25 MG tablet Take 1 tablet (25 mg total) by mouth daily. 90 tablet 1   lisinopril (ZESTRIL) 40 MG tablet Take 1  tablet (40 mg total) by mouth daily. 90 tablet 1   omeprazole (PRILOSEC) 20 MG capsule Take 1 capsule (20 mg total) by mouth daily. 90 capsule 1   potassium chloride SA (KLOR-CON M) 20 MEQ tablet Take 1 tablet (20 mEq total) by mouth 2 (two) times daily. 180 tablet 1   tirzepatide (MOUNJARO) 10 MG/0.5ML Pen Inject 10 mg into the skin once a week. 6 mL 0   No current facility-administered medications on file prior to visit.    BP 120/84 (BP Location: Left Arm, Patient Position: Sitting, Cuff Size: Large)   Pulse 75   Temp 98.1 F (36.7 C) (Oral)   Ht 6' (1.829 m)   Wt (!) 410 lb 3.2 oz (186.1 kg)   SpO2 98%   BMI 55.63 kg/m       Objective:   Physical Exam Vitals and nursing note reviewed.  Constitutional:      Appearance: Normal appearance.  Skin:    General: Skin is warm and dry.  Neurological:     General: No focal deficit present.     Mental Status: He is alert  and oriented to person, place, and time.  Psychiatric:        Mood and Affect: Mood normal.        Behavior: Behavior normal.        Thought Content: Thought content normal.        Judgment: Judgment normal.       Assessment & Plan:  1. Attention deficit hyperactivity disorder (ADHD), predominantly inattentive type -Information given on [facility] for formal testing and treatment of ADD.  He will contact them to make an appointment  Cory Nafziger, NP    

## 2021-11-01 ENCOUNTER — Other Ambulatory Visit: Payer: Self-pay | Admitting: Adult Health

## 2021-11-01 MED ORDER — TIRZEPATIDE 12.5 MG/0.5ML ~~LOC~~ SOAJ: 12.5000 mg | SUBCUTANEOUS | 0 refills | Status: DC

## 2021-12-02 ENCOUNTER — Telehealth (INDEPENDENT_AMBULATORY_CARE_PROVIDER_SITE_OTHER): Payer: BC Managed Care – PPO | Admitting: Adult Health

## 2021-12-02 ENCOUNTER — Encounter: Payer: Self-pay | Admitting: Adult Health

## 2021-12-02 VITALS — Ht 72.0 in | Wt 398.0 lb

## 2021-12-02 DIAGNOSIS — R051 Acute cough: Secondary | ICD-10-CM | POA: Diagnosis not present

## 2021-12-02 DIAGNOSIS — J0141 Acute recurrent pansinusitis: Secondary | ICD-10-CM

## 2021-12-02 MED ORDER — BENZONATATE 200 MG PO CAPS: 200.0000 mg | ORAL_CAPSULE | Freq: Two times a day (BID) | ORAL | 1 refills | Status: DC | PRN

## 2021-12-02 MED ORDER — DOXYCYCLINE HYCLATE 100 MG PO CAPS: 100.0000 mg | ORAL_CAPSULE | Freq: Two times a day (BID) | ORAL | 0 refills | Status: DC

## 2021-12-02 MED ORDER — FLUTICASONE PROPIONATE 50 MCG/ACT NA SUSP: 2.0000 | Freq: Every day | NASAL | 0 refills | Status: DC

## 2021-12-02 NOTE — Progress Notes (Signed)
Virtual Visit via Video Note  I connected with Jordan Trujillo on 12/02/21 at  3:00 PM EDT by a video enabled telemedicine application and verified that I am speaking with the correct person using two identifiers.  Location patient: home Location provider:work or home office Persons participating in the virtual visit: patient, provider  I discussed the limitations of evaluation and management by telemedicine and the availability of in person appointments. The patient expressed understanding and agreed to proceed.   HPI:  41-year-old male who is being evaluated today for an acute issue.  He reports that his symptoms started roughly 2 weeks ago with sinus pain and pressure, productive cough with thick yellow mucus, rhinorrhea with thick yellow mucus that is blood-tinged, sore throat and fevers and body aches.  He reports that the fevers, body aches, and sore throat have resolved but he is stuck with the other symptoms as well as a hoarse voice.  Multiple people at his work had the same symptoms but he is still only 1 left with symptoms.  ROS: See pertinent positives and negatives per HPI.  Past Medical History:  Diagnosis Date   Chicken pox    DVT (deep venous thrombosis) (HCC)    GERD (gastroesophageal reflux disease)    Hypertension    Kidney stones    Morbid obesity (HCC)     Past Surgical History:  Procedure Laterality Date   KNEE ARTHROSCOPY W/ MENISCAL REPAIR     Left    Family History  Problem Relation Age of Onset   Diabetes Father    Hypertension Father    Arthritis Father    Heart disease Father    Breast cancer Maternal Grandmother        Current Outpatient Medications:    ACCU-CHEK GUIDE test strip, USE 1 STRIP TO CHECK GLUCOSE UP TO 4 TIMES DAILY AS DIRECTED, Disp: , Rfl:    Accu-Chek Softclix Lancets lancets, SMARTSIG:1 Topical 1-4 Times Daily, Disp: , Rfl:    benzonatate (TESSALON) 200 MG capsule, Take 1 capsule (200 mg total) by mouth 2 (two) times daily as  needed for cough., Disp: 20 capsule, Rfl: 1   blood glucose meter kit and supplies KIT, Dispense based on patient and insurance preference. Use up to four times daily as directed., Disp: 1 each, Rfl: 0   cyclobenzaprine (FLEXERIL) 10 MG tablet, Take 1 tablet (10 mg total) by mouth 3 (three) times daily as needed for muscle spasms., Disp: 30 tablet, Rfl: 0   doxycycline (VIBRAMYCIN) 100 MG capsule, Take 1 capsule (100 mg total) by mouth 2 (two) times daily., Disp: 14 capsule, Rfl: 0   fluticasone (FLONASE) 50 MCG/ACT nasal spray, Place 2 sprays into both nostrils daily., Disp: 9.9 g, Rfl: 0   hydrochlorothiazide (HYDRODIURIL) 25 MG tablet, Take 1 tablet (25 mg total) by mouth daily., Disp: 90 tablet, Rfl: 1   lisinopril (ZESTRIL) 40 MG tablet, Take 1 tablet (40 mg total) by mouth daily., Disp: 90 tablet, Rfl: 1   tirzepatide (MOUNJARO) 12.5 MG/0.5ML Pen, Inject 12.5 mg into the skin once a week., Disp: 6 mL, Rfl: 0   omeprazole (PRILOSEC) 20 MG capsule, Take 1 capsule (20 mg total) by mouth daily., Disp: 90 capsule, Rfl: 1   potassium chloride SA (KLOR-CON M) 20 MEQ tablet, Take 1 tablet (20 mEq total) by mouth 2 (two) times daily., Disp: 180 tablet, Rfl: 1  EXAM:  VITALS per patient if applicable:  GENERAL: alert, oriented, appears well and in no acute distress  HEENT: atraumatic, conjunttiva clear, no obvious abnormalities on inspection of external nose and ears  NECK: normal movements of the head and neck  LUNGS: on inspection no signs of respiratory distress, breathing rate appears normal, no obvious gross SOB, gasping or wheezing  CV: no obvious cyanosis  MS: moves all visible extremities without noticeable abnormality  PSYCH/NEURO: pleasant and cooperative, no obvious depression or anxiety, speech and thought processing grossly intact  ASSESSMENT AND PLAN:  Discussed the following assessment and plan:  1. Acute recurrent pansinusitis  - doxycycline (VIBRAMYCIN) 100 MG capsule;  Take 1 capsule (100 mg total) by mouth 2 (two) times daily.  Dispense: 14 capsule; Refill: 0 - fluticasone (FLONASE) 50 MCG/ACT nasal spray; Place 2 sprays into both nostrils daily.  Dispense: 9.9 g; Refill: 0  2. Acute cough  - benzonatate (TESSALON) 200 MG capsule; Take 1 capsule (200 mg total) by mouth 2 (two) times daily as needed for cough.  Dispense: 20 capsule; Refill: 1     I discussed the assessment and treatment plan with the patient. The patient was provided an opportunity to ask questions and all were answered. The patient agreed with the plan and demonstrated an understanding of the instructions.   The patient was advised to call back or seek an in-person evaluation if the symptoms worsen or if the condition fails to improve as anticipated.   [name], NP

## 2021-12-16 ENCOUNTER — Encounter: Payer: Self-pay | Admitting: Adult Health

## 2021-12-17 ENCOUNTER — Other Ambulatory Visit: Payer: Self-pay | Admitting: Adult Health

## 2022-01-06 ENCOUNTER — Ambulatory Visit: Payer: BC Managed Care – PPO | Admitting: Adult Health

## 2022-01-06 VITALS — BP 118/78 | HR 67 | Temp 98.6°F | Wt 391.0 lb

## 2022-01-06 DIAGNOSIS — E1169 Type 2 diabetes mellitus with other specified complication: Secondary | ICD-10-CM

## 2022-01-06 DIAGNOSIS — I10 Essential (primary) hypertension: Secondary | ICD-10-CM

## 2022-01-06 MED ORDER — LISINOPRIL 20 MG PO TABS: 20.0000 mg | ORAL_TABLET | Freq: Every day | ORAL | 3 refills | Status: DC

## 2022-01-06 NOTE — Patient Instructions (Signed)
It was great seeing you today   Your A1c was 5.0   I am going to keep you on Mounjaro 12.5 mg weekly.   Follow up in 3 months

## 2022-01-06 NOTE — Addendum Note (Signed)
Addended by: Octavio Manns E on: 01/06/2022 07:51 AM   Modules accepted: Orders

## 2022-01-06 NOTE — Progress Notes (Signed)
Subjective:    Patient ID: Jordan Trujillo, male    DOB: 1980-08-08, 41 y.o.   MRN: [id number]  HPI 41 year old male who  has a past medical history of Chicken pox, DVT (deep venous thrombosis) ([place]), GERD (gastroesophageal reflux disease), Hypertension, Kidney stones, and Morbid obesity ([place]).  He presents to the office today for 43-monthfollow-up regarding hypertension and diabetes  Hypertension-managed with 40 mg and HCTZ 25 mg. He reports that he had to cut his lisinopril in half due to dizziness throughout the day.  BP Readings from Last 3 Encounters:  01/06/22 118/78  10/26/21 120/84  09/07/21 (!) 120/58   Diabetes mellitus type 2-diagnosed in May 2023 at which time his A1c was 6.7.  We trialed him on metformin 500 mg daily but this caused GI issues, we then discontinued this medication and [facility]  He has been doing well with this medication and we have been able to titrate him up to 12.5 mg weekly.  His appetite has decreased and he is having less cravings.  He has been eating healthier. He has been able to lose about 85 pounds.   Lab Results  Component Value Date   HGBA1C 6.7 (H) 06/01/2021   Wt Readings from Last 10 Encounters:  01/06/22 (!) 391 lb (177.4 kg)  12/02/21 (!) 398 lb (180.5 kg)  10/26/21 (!) 410 lb 3.2 oz (186.1 kg)  09/07/21 (!) 425 lb (192.8 kg)  07/06/21 (!) 452 lb (205 kg)  06/01/21 (!) 476 lb (215.9 kg)  05/19/21 (!) 478 lb (216.8 kg)  12/29/20 (!) 446 lb (202.3 kg)  12/22/20 (!) 445 lb (201.9 kg)  12/08/20 (!) 459 lb (208.2 kg)   Review of Systems See HPI   Past Medical History:  Diagnosis Date   Chicken pox    DVT (deep venous thrombosis) (HCC)    GERD (gastroesophageal reflux disease)    Hypertension    Kidney stones    Morbid obesity (HCC)     Social History   Socioeconomic History   Marital status: Married    Spouse name: Not on file   Number of children: Not on file   Years of education: Not on file    Highest education level: Not on file  Occupational History   Not on file  Tobacco Use   Smoking status: Every Day    Types: Cigarettes   Smokeless tobacco: Never  Vaping Use   Vaping Use: Never used  Substance and Sexual Activity   Alcohol use: No   Drug use: No   Sexual activity: Not on file  Other Topics Concern   Not on file  Social History Narrative   High School education    Social Determinants of Health   Financial Resource Strain: Not on file  Food Insecurity: Not on file  Transportation Needs: Not on file  Physical Activity: Not on file  Stress: Not on file  Social Connections: Not on file  Intimate Partner Violence: Not on file    Past Surgical History:  Procedure Laterality Date   KNEE ARTHROSCOPY W/ MENISCAL REPAIR     Left    Family History  Problem Relation Age of Onset   Diabetes Father    Hypertension Father    Arthritis Father    Heart disease Father    Breast cancer Maternal Grandmother     Allergies  Allergen Reactions   Amoxicillin Hives   Morphine Other (See Comments)    angry  Requip [Ropinirole Hcl] Other (See Comments)    Kept awake for 3 days    Current Outpatient Medications on File Prior to Visit  Medication Sig Dispense Refill   ACCU-CHEK GUIDE test strip USE 1 STRIP TO CHECK GLUCOSE UP TO 4 TIMES DAILY AS DIRECTED     Accu-Chek Softclix Lancets lancets SMARTSIG:1 Topical 1-4 Times Daily     blood glucose meter kit and supplies KIT Dispense based on patient and insurance preference. Use up to four times daily as directed. 1 each 0   hydrochlorothiazide (HYDRODIURIL) 25 MG tablet Take 1 tablet by mouth once daily 90 tablet 0   lisinopril (ZESTRIL) 40 MG tablet Take 1 tablet by mouth once daily 90 tablet 0   tirzepatide (MOUNJARO) 12.5 MG/0.5ML Pen Inject 12.5 mg into the skin once a week. 6 mL 0   omeprazole (PRILOSEC) 20 MG capsule Take 1 capsule (20 mg total) by mouth daily. 90 capsule 1   potassium chloride SA (KLOR-CON M) 20  MEQ tablet Take 1 tablet (20 mEq total) by mouth 2 (two) times daily. 180 tablet 1   No current facility-administered medications on file prior to visit.    BP 118/78 (BP Location: Left Arm, Patient Position: Sitting, Cuff Size: Large)   Pulse 67   Temp 98.6 F (37 C)   Wt (!) 391 lb (177.4 kg)   SpO2 97%   BMI 53.03 kg/m       Objective:   Physical Exam Vitals and nursing note reviewed.  Constitutional:      Appearance: Normal appearance. He is obese.  Cardiovascular:     Rate and Rhythm: Normal rate and regular rhythm.     Pulses: Normal pulses.     Heart sounds: Normal heart sounds.  Pulmonary:     Effort: Pulmonary effort is normal.     Breath sounds: Normal breath sounds.  Skin:    General: Skin is warm and dry.     Capillary Refill: Capillary refill takes less than 2 seconds.  Neurological:     General: No focal deficit present.     Mental Status: He is alert and oriented to person, place, and time.  Psychiatric:        Mood and Affect: Mood normal.        Behavior: Behavior normal.        Thought Content: Thought content normal.        Judgment: Judgment normal.       Assessment & Plan:  1. Type 2 diabetes mellitus with other specified complication, without long-term current use of insulin (HCC)  - Microalbumin/Creatinine Ratio, Urine; Future - POC HgB A1c; Future- 5.0  - Follow up in 3 months   2. Essential hypertension, benign - well controlled.  - Will switch his lisinopril to 20 mg daily.  [name], NP

## 2022-01-20 ENCOUNTER — Other Ambulatory Visit: Payer: Self-pay | Admitting: Adult Health

## 2022-01-20 MED ORDER — TIRZEPATIDE 12.5 MG/0.5ML ~~LOC~~ SOAJ: 12.5000 mg | SUBCUTANEOUS | 0 refills | Status: DC

## 2022-02-15 ENCOUNTER — Other Ambulatory Visit: Payer: Self-pay | Admitting: Adult Health

## 2022-02-15 MED ORDER — HYDROCHLOROTHIAZIDE 25 MG PO TABS: 25.0000 mg | ORAL_TABLET | Freq: Every day | ORAL | 0 refills | Status: DC

## 2022-02-22 ENCOUNTER — Other Ambulatory Visit: Payer: Self-pay | Admitting: Adult Health

## 2022-02-22 DIAGNOSIS — K2100 Gastro-esophageal reflux disease with esophagitis, without bleeding: Secondary | ICD-10-CM

## 2022-03-19 ENCOUNTER — Encounter: Payer: Self-pay | Admitting: Adult Health

## 2022-03-21 MED ORDER — TIRZEPATIDE 15 MG/0.5ML ~~LOC~~ SOAJ: 15.0000 mg | SUBCUTANEOUS | 0 refills | Status: DC

## 2022-03-21 NOTE — Telephone Encounter (Signed)
Please advise 

## 2022-03-22 ENCOUNTER — Telehealth: Payer: Self-pay | Admitting: Adult Health

## 2022-03-22 NOTE — Telephone Encounter (Signed)
Pt is calling and wife has not called mail order however she has called 5 pharmacies and they do not have (MOUNJARO) 12.5 MG/0.5ML Pen  nor mounjaro 15 mg. Please advise

## 2022-03-23 NOTE — Telephone Encounter (Signed)
Please advise 

## 2022-03-24 NOTE — Telephone Encounter (Signed)
Spoke to pt and he stated that he has not missed a dose. Pt also informed me the pharmacy called and told him the '15mg'$  dose was ready for pick up. Pt has not been checking blood sugars.

## 2022-05-12 ENCOUNTER — Telehealth: Payer: Self-pay | Admitting: Adult Health

## 2022-05-12 ENCOUNTER — Other Ambulatory Visit: Payer: Self-pay | Admitting: Adult Health

## 2022-05-12 MED ORDER — ZEPBOUND 12.5 MG/0.5ML ~~LOC~~ SOAJ: 12.5000 mg | SUBCUTANEOUS | 1 refills | Status: DC

## 2022-05-12 NOTE — Telephone Encounter (Signed)
Please advise 

## 2022-05-12 NOTE — Telephone Encounter (Signed)
Pharmacy called to say they are still on back order for:  tirzepatide [facility]) 15 MG/0.5ML Pen  But, they do however have the Zepbound 15 MG  Pharmacy is asking if NP thinks it would be ok to send in an Rx &/or PA for the Zepbound, instead?  Please advise.  [facility] - [place], [place] - [address] Phone: [phone]  Fax: [phone]

## 2022-05-12 NOTE — Telephone Encounter (Signed)
They have one box left. I placed the order for pt.

## 2022-05-12 NOTE — Telephone Encounter (Signed)
Spoke to pt regarding update but. Pt stated that the pharmacy sent in the 12.5 mounjaro for him already. Pt declined zepbound. Tried to call pharmacy to cancel but was on hold too long.

## 2022-05-31 ENCOUNTER — Ambulatory Visit: Payer: BC Managed Care – PPO | Admitting: Adult Health

## 2022-05-31 VITALS — BP 110/80 | HR 64 | Temp 98.2°F | Ht 72.0 in | Wt 355.0 lb

## 2022-05-31 DIAGNOSIS — E1169 Type 2 diabetes mellitus with other specified complication: Secondary | ICD-10-CM | POA: Diagnosis not present

## 2022-05-31 DIAGNOSIS — Z7985 Long-term (current) use of injectable non-insulin antidiabetic drugs: Secondary | ICD-10-CM

## 2022-05-31 DIAGNOSIS — I10 Essential (primary) hypertension: Secondary | ICD-10-CM

## 2022-05-31 LAB — POCT GLYCOSYLATED HEMOGLOBIN (HGB A1C): Hemoglobin A1C: 5 % (ref 4.0–5.6)

## 2022-05-31 LAB — MICROALBUMIN / CREATININE URINE RATIO
Creatinine,U: 333.1 mg/dL
Microalb Creat Ratio: 0.4 mg/g (ref 0.0–30.0)
Microalb, Ur: 1.5 mg/dL (ref 0.0–1.9)

## 2022-05-31 NOTE — Progress Notes (Signed)
Subjective:    Patient ID: Jordan Trujillo, male    DOB: 16-Mar-1980, 42 y.o.   MRN: [id number]  HPI 42 year old male who  has a past medical history of Chicken pox, DVT (deep venous thrombosis) (HCC), GERD (gastroesophageal reflux disease), Hypertension, Kidney stones, and Morbid obesity (HCC).  He presents to the office today for 109-month follow-up regarding hypertension/DM Type 2/Obesity.   Hypertension-managed with 20 mg and HCTZ 25 mg. He denies dizziness, lightheadedness, blurred vision, or headaches.   BP Readings from Last 3 Encounters:  05/31/22 110/80  01/06/22 118/78  10/26/21 120/84   Diabetes mellitus type 2-diagnosed in May 2023 at which time his A1c was 6.7.  We trialed him on metformin 500 mg daily but this caused GI issues, we then discontinued this medication and switched him to [facility].  He has been doing well with this medication and we have been able to titrate him up to 12.5 mg weekly.  His appetite has decreased and he is having less cravings.  He has been eating healthier and he is walking more. His job involves Environmental manager. [employer] His last A1c was 6.7 Lab Results  Component Value Date   HGBA1C 5.0 05/31/2022   Obesity -managed with diet and exercise as well as Mounjaro.  He has been able to lose about 125 lbs.  He has dropped 12 pant sizes since starting mounjaro.  Wt Readings from Last 8 Encounters:  05/31/22 (!) 355 lb (161 kg)  01/06/22 (!) 391 lb (177.4 kg)  12/02/21 (!) 398 lb (180.5 kg)  10/26/21 (!) 410 lb 3.2 oz (186.1 kg)  09/07/21 (!) 425 lb (192.8 kg)  07/06/21 (!) 452 lb (205 kg)  06/01/21 (!) 476 lb (215.9 kg)  05/19/21 (!) 478 lb (216.8 kg)    Review of Systems See HPI   Past Medical History:  Diagnosis Date   Chicken pox    DVT (deep venous thrombosis) (HCC)    GERD (gastroesophageal reflux disease)    Hypertension    Kidney stones    Morbid obesity (HCC)     Social History   Socioeconomic History   Marital status: Married     Spouse name: Not on file   Number of children: Not on file   Years of education: Not on file   Highest education level: Not on file  Occupational History   Not on file  Tobacco Use   Smoking status: Every Day    Types: Cigarettes   Smokeless tobacco: Never  Vaping Use   Vaping Use: Never used  Substance and Sexual Activity   Alcohol use: No   Drug use: No   Sexual activity: Not on file  Other Topics Concern   Not on file  Social History Narrative   High School education    Social Determinants of Health   Financial Resource Strain: Not on file  Food Insecurity: Not on file  Transportation Needs: Not on file  Physical Activity: Not on file  Stress: Not on file  Social Connections: Not on file  Intimate Partner Violence: Not on file    Past Surgical History:  Procedure Laterality Date   KNEE ARTHROSCOPY W/ MENISCAL REPAIR     Left    Family History  Problem Relation Age of Onset   Diabetes Father    Hypertension Father    Arthritis Father    Heart disease Father    Breast cancer Maternal Grandmother     Allergies  Allergen Reactions  Amoxicillin Hives   Morphine Other (See Comments)    angry   Requip [Ropinirole Hcl] Other (See Comments)    Kept awake for 3 days    Current Outpatient Medications on File Prior to Visit  Medication Sig Dispense Refill   ACCU-CHEK GUIDE test strip USE 1 STRIP TO CHECK GLUCOSE UP TO 4 TIMES DAILY AS DIRECTED     Accu-Chek Softclix Lancets lancets SMARTSIG:1 Topical 1-4 Times Daily     amphetamine-dextroamphetamine (ADDERALL XR) 15 MG 24 hr capsule Take by mouth daily.     amphetamine-dextroamphetamine (ADDERALL XR) 25 MG 24 hr capsule Take by mouth daily.     blood glucose meter kit and supplies KIT Dispense based on patient and insurance preference. Use up to four times daily as directed. 1 each 0   hydrochlorothiazide (HYDRODIURIL) 25 MG tablet Take 1 tablet (25 mg total) by mouth daily. 90 tablet 0   lisinopril  (ZESTRIL) 20 MG tablet Take 1 tablet (20 mg total) by mouth daily. 90 tablet 3   omeprazole (PRILOSEC) 20 MG capsule TAKE 1 CAPSULE DAILY 90 capsule 3   tirzepatide (MOUNJARO) 15 MG/0.5ML Pen Inject 15 mg into the skin once a week. 6 mL 0   potassium chloride SA (KLOR-CON M) 20 MEQ tablet Take 1 tablet (20 mEq total) by mouth 2 (two) times daily. 180 tablet 1   No current facility-administered medications on file prior to visit.    BP 110/80   Pulse 64   Temp 98.2 F (36.8 C) (Oral)   Ht 6' (1.829 m)   Wt (!) 355 lb (161 kg)   SpO2 96%   BMI 48.15 kg/m       Objective:   Physical Exam Vitals and nursing note reviewed.  Constitutional:      Appearance: Normal appearance.  Cardiovascular:     Rate and Rhythm: Normal rate and regular rhythm.     Pulses: Normal pulses.     Heart sounds: Normal heart sounds.  Pulmonary:     Effort: Pulmonary effort is normal.     Breath sounds: Normal breath sounds.  Musculoskeletal:        General: Normal range of motion.  Skin:    General: Skin is warm and dry.  Neurological:     General: No focal deficit present.     Mental Status: He is alert and oriented to person, place, and time.  Psychiatric:        Mood and Affect: Mood normal.        Thought Content: Thought content normal.        Judgment: Judgment normal.       Assessment & Plan:  1. Type 2 diabetes mellitus with other specified complication, without long-term current use of insulin (HCC)  - POC HgB A1c- 5.0  - Continue with Mounjaro and lifestyle modifications  - Microalbumin/Creatinine Ratio, Urine - Follow up in 3 months   2. Essential hypertension, benign - Will have him try stopping HCTZ and monitor his blood pressure at home  - Microalbumin/Creatinine Ratio, Urine  3. MORBID OBESITY - He has lost about 125 lbs  - Keep up the good work  - Add in Runner, broadcasting/film/video   Shirline Frees, NP

## 2022-06-01 ENCOUNTER — Other Ambulatory Visit: Payer: Self-pay | Admitting: Family Medicine

## 2022-06-23 ENCOUNTER — Other Ambulatory Visit: Payer: Self-pay | Admitting: Adult Health

## 2022-07-10 ENCOUNTER — Other Ambulatory Visit (HOSPITAL_COMMUNITY): Payer: Self-pay

## 2022-07-10 MED ORDER — AMPHETAMINE-DEXTROAMPHET ER 30 MG PO CP24
30.0000 mg | ORAL_CAPSULE | Freq: Every day | ORAL | 0 refills | Status: DC
  Filled 2022-07-10: qty 30, 30d supply, fill #0

## 2022-07-12 ENCOUNTER — Other Ambulatory Visit: Payer: Self-pay | Admitting: Adult Health

## 2022-08-07 ENCOUNTER — Other Ambulatory Visit (HOSPITAL_COMMUNITY): Payer: Self-pay

## 2022-08-07 MED ORDER — AMPHETAMINE-DEXTROAMPHET ER 30 MG PO CP24
30.0000 mg | ORAL_CAPSULE | Freq: Every day | ORAL | 0 refills | Status: DC
  Filled 2022-08-07: qty 30, 30d supply, fill #0

## 2022-09-05 ENCOUNTER — Other Ambulatory Visit (HOSPITAL_COMMUNITY): Payer: Self-pay

## 2022-09-05 MED ORDER — AMPHETAMINE-DEXTROAMPHET ER 30 MG PO CP24
30.0000 mg | ORAL_CAPSULE | Freq: Every day | ORAL | 0 refills | Status: DC
  Filled 2022-09-05: qty 30, 30d supply, fill #0

## 2022-10-06 ENCOUNTER — Other Ambulatory Visit (HOSPITAL_COMMUNITY): Payer: Self-pay

## 2022-10-06 MED ORDER — AMPHETAMINE-DEXTROAMPHET ER 30 MG PO CP24
30.0000 mg | ORAL_CAPSULE | Freq: Every day | ORAL | 0 refills | Status: DC
  Filled 2022-10-06: qty 30, 30d supply, fill #0

## 2022-10-11 ENCOUNTER — Other Ambulatory Visit: Payer: Self-pay

## 2022-10-11 ENCOUNTER — Telehealth: Payer: Self-pay | Admitting: Adult Health

## 2022-10-11 ENCOUNTER — Ambulatory Visit: Payer: BC Managed Care – PPO | Admitting: Adult Health

## 2022-10-11 ENCOUNTER — Encounter: Payer: Self-pay | Admitting: Adult Health

## 2022-10-11 VITALS — BP 110/80 | HR 84 | Temp 98.1°F | Ht 72.0 in | Wt 336.0 lb

## 2022-10-11 DIAGNOSIS — I10 Essential (primary) hypertension: Secondary | ICD-10-CM

## 2022-10-11 DIAGNOSIS — L0291 Cutaneous abscess, unspecified: Secondary | ICD-10-CM

## 2022-10-11 DIAGNOSIS — E1169 Type 2 diabetes mellitus with other specified complication: Secondary | ICD-10-CM

## 2022-10-11 DIAGNOSIS — Z7985 Long-term (current) use of injectable non-insulin antidiabetic drugs: Secondary | ICD-10-CM

## 2022-10-11 LAB — POCT GLYCOSYLATED HEMOGLOBIN (HGB A1C): Hemoglobin A1C: 4.9 % (ref 4.0–5.6)

## 2022-10-11 MED ORDER — DOXYCYCLINE HYCLATE 100 MG PO CAPS
100.0000 mg | ORAL_CAPSULE | Freq: Two times a day (BID) | ORAL | 0 refills | Status: DC
Start: 2022-10-11 — End: 2022-10-11

## 2022-10-11 MED ORDER — DOXYCYCLINE HYCLATE 100 MG PO CAPS
100.0000 mg | ORAL_CAPSULE | Freq: Two times a day (BID) | ORAL | 0 refills | Status: DC
Start: 2022-10-11 — End: 2023-04-12

## 2022-10-11 MED ORDER — LISINOPRIL 10 MG PO TABS
10.0000 mg | ORAL_TABLET | Freq: Every day | ORAL | 3 refills | Status: DC
Start: 2022-10-11 — End: 2023-04-12

## 2022-10-11 NOTE — Progress Notes (Signed)
Subjective:    Patient ID: Jordan Trujillo, male    DOB: [birth year], 42 y.o.   MRN: [id number]  HPI  42 year old male who  has a past medical history of Chicken pox, DVT (deep venous thrombosis) (HCC), GERD (gastroesophageal reflux disease), Hypertension, Kidney stones, and Morbid obesity (HCC).  He presents to the office today for 34-month follow-up regarding hypertension/DM Type 2/Obesity.   Hypertension-managed with lisinopril  20 mg. He does endorse dizziness when standing and bending over.  BP Readings from Last 3 Encounters:  10/11/22 110/80  05/31/22 110/80  01/06/22 118/78   Diabetes mellitus type 2-diagnosed in May 2023 at which time his A1c was 6.7.  We trialed him on metformin 500 mg daily but this caused GI issues, we then discontinued this medication and switched him to [facility].  He has been doing well with this medication and we have been able to titrate him up to 15 mg weekly.  His appetite has decreased and he is having less cravings.  He has been eating healthier and he is walking more. His job involves Environmental manager. 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  He has been able to lose about 145 lbs.  He has dropped 12 pant sizes since starting mounjaro.  Wt Readings from Last 10 Encounters:  10/11/22 (!) 336 lb (152.4 kg)  05/31/22 (!) 355 lb (161 kg)  01/06/22 (!) 391 lb (177.4 kg)  12/02/21 (!) 398 lb (180.5 kg)  10/26/21 (!) 410 lb 3.2 oz (186.1 kg)  09/07/21 (!) 425 lb (192.8 kg)  07/06/21 (!) 452 lb (205 kg)  06/01/21 (!) 476 lb (215.9 kg)  05/19/21 (!) 478 lb (216.8 kg)  12/29/20 (!) 446 lb (202.3 kg)   Furthermore, he reports that for the last week he has developed an abscess on his right flank. Two days ago the abscess drained. Has less pain at the site of the abscess since it has drained.   Review of Systems See HPI     Past Medical History:   Diagnosis Date   Chicken pox    DVT (deep venous thrombosis) (HCC)    GERD (gastroesophageal reflux disease)    Hypertension    Kidney stones    Morbid obesity (HCC)     Social History   Socioeconomic History   Marital status: Married    Spouse name: Not on file   Number of children: Not on file   Years of education: Not on file   Highest education level: Not on file  Occupational History   Not on file  Tobacco Use   Smoking status: Every Day    Types: Cigarettes   Smokeless tobacco: Never  Vaping Use   Vaping status: Never Used  Substance and Sexual Activity   Alcohol use: No   Drug use: No   Sexual activity: Not on file  Other Topics Concern   Not on file  Social History Narrative   High School education    Social Determinants of Health   Financial Resource Strain: Not on file  Food Insecurity: Not on file  Transportation Needs: Not on file  Physical Activity: Not on file  Stress: No Stress Concern Present (04/27/2020)   Received from [facility], [facility]   [employer] of Occupational [facility] - Occupational Stress Questionnaire    Feeling of Stress : Not at  all  Social Connections: Unknown (05/31/2021)   Received from [facility], [facility]   Social Network    Social Network: Not on file  Intimate Partner Violence: Unknown (05/02/2021)   Received from [facility], [facility]   HITS    Physically Hurt: Not on file    Insult or Talk Down To: Not on file    Threaten Physical Harm: Not on file    Scream or Curse: Not on file    Past Surgical History:  Procedure Laterality Date   KNEE ARTHROSCOPY W/ MENISCAL REPAIR     Left    Family History  Problem Relation Age of Onset   Diabetes Father    Hypertension Father    Arthritis Father    Heart disease Father    Breast cancer Maternal Grandmother     Allergies  Allergen Reactions   Amoxicillin Hives   Morphine Other (See Comments)    angry   Requip [Ropinirole Hcl] Other (See  Comments)    Kept awake for 3 days    Current Outpatient Medications on File Prior to Visit  Medication Sig Dispense Refill   ACCU-CHEK GUIDE test strip USE 1 STRIP TO CHECK GLUCOSE UP TO 4 TIMES DAILY AS DIRECTED     Accu-Chek Softclix Lancets lancets SMARTSIG:1 Topical 1-4 Times Daily     amphetamine-dextroamphetamine (ADDERALL XR) 30 MG 24 hr capsule Take 1 capsule (30 mg total) by mouth daily. 30 capsule 0   blood glucose meter kit and supplies KIT Dispense based on patient and insurance preference. Use up to four times daily as directed. 1 each 0   hydrochlorothiazide (HYDRODIURIL) 25 MG tablet Take 1 tablet (25 mg total) by mouth daily. 90 tablet 0   KLOR-CON M20 20 MEQ tablet TAKE 1 TABLET DAILY 90 tablet 3   lisinopril (ZESTRIL) 20 MG tablet Take 1 tablet (20 mg total) by mouth daily. 90 tablet 3   MOUNJARO 15 MG/0.5ML Pen INJECT 15 MG INTO THE SKIN ONCE A WEEK 12 mL 0   omeprazole (PRILOSEC) 20 MG capsule TAKE 1 CAPSULE DAILY 90 capsule 3   No current facility-administered medications on file prior to visit.    BP 110/80   Pulse 84   Temp 98.1 F (36.7 C) (Oral)   Ht 6' (1.829 m)   Wt (!) 336 lb (152.4 kg)   SpO2 99%   BMI 45.57 kg/m       Objective:   Physical Exam Vitals and nursing note reviewed.  Constitutional:      Appearance: Normal appearance. He is obese.  Cardiovascular:     Rate and Rhythm: Normal rate and regular rhythm.     Pulses: Normal pulses.     Heart sounds: Normal heart sounds.  Pulmonary:     Effort: Pulmonary effort is normal.     Breath sounds: Normal breath sounds.  Skin:    General: Skin is warm and dry.     Comments: Non draining abscess noted on right flank   Neurological:     General: No focal deficit present.     Mental Status: He is alert and oriented to person, place, and time.  Psychiatric:        Mood and Affect: Mood normal.        Behavior: Behavior normal.        Thought Content: Thought content normal.        Judgment:  Judgment normal.  Assessment & Plan:  1. Type 2 diabetes mellitus with other specified complication, without long-term current use of insulin (HCC)  - POC HgB A1c- 4.9  - He has lost about 145 pounds! - Continue to eat healthy and exercise - Follow up in 6 months   2. Essential hypertension, benign - Decrease lisinopril to 10 mg daily  - lisinopril (ZESTRIL) 10 MG tablet; Take 1 tablet (10 mg total) by mouth daily.  Dispense: 90 tablet; Refill: 3  3. Abscess - Will treat with doxy to clear abscess - doxycycline (VIBRAMYCIN) 100 MG capsule; Take 1 capsule (100 mg total) by mouth 2 (two) times daily.  Dispense: 14 capsule; Refill: 0  Shirline Frees, NP

## 2022-10-11 NOTE — Telephone Encounter (Signed)
Noted.  Rx sent.

## 2022-10-11 NOTE — Telephone Encounter (Signed)
Per pt wife Bonita Quin, the following prescription is not to go to [facility], is to go to [facility]:  doxycycline (VIBRAMYCIN) 100 MG capsule   [address] [place], [place] - [address] Phone: [phone]  Fax: [phone]

## 2022-10-11 NOTE — Patient Instructions (Addendum)
Your A1c was 4.9!   You have lost about 145 pounds - great job!   Lets follow up in 6 months

## 2022-10-16 ENCOUNTER — Other Ambulatory Visit: Payer: Self-pay | Admitting: Adult Health

## 2022-10-17 MED ORDER — MOUNJARO 15 MG/0.5ML ~~LOC~~ SOAJ: 15.0000 mg | SUBCUTANEOUS | 0 refills | Status: DC

## 2022-11-07 ENCOUNTER — Other Ambulatory Visit (HOSPITAL_COMMUNITY): Payer: Self-pay

## 2022-11-07 MED ORDER — AMPHETAMINE-DEXTROAMPHET ER 30 MG PO CP24
30.0000 mg | ORAL_CAPSULE | Freq: Every day | ORAL | 0 refills | Status: DC
Start: 2022-11-07 — End: 2022-12-06
  Filled 2022-11-07: qty 30, 30d supply, fill #0

## 2022-11-08 ENCOUNTER — Other Ambulatory Visit (HOSPITAL_COMMUNITY): Payer: Self-pay

## 2022-12-06 ENCOUNTER — Other Ambulatory Visit (HOSPITAL_COMMUNITY): Payer: Self-pay

## 2022-12-06 MED ORDER — AMPHETAMINE-DEXTROAMPHET ER 30 MG PO CP24
30.0000 mg | ORAL_CAPSULE | Freq: Every day | ORAL | 0 refills | Status: DC
Start: 2022-12-06 — End: 2023-01-04
  Filled 2022-12-06: qty 30, 30d supply, fill #0

## 2023-01-04 ENCOUNTER — Other Ambulatory Visit (HOSPITAL_COMMUNITY): Payer: Self-pay

## 2023-01-04 MED ORDER — AMPHETAMINE-DEXTROAMPHET ER 30 MG PO CP24
30.0000 mg | ORAL_CAPSULE | Freq: Every day | ORAL | 0 refills | Status: DC
  Filled 2023-01-04: qty 30, 30d supply, fill #0

## 2023-01-05 ENCOUNTER — Other Ambulatory Visit (HOSPITAL_COMMUNITY): Payer: Self-pay

## 2023-01-05 ENCOUNTER — Other Ambulatory Visit: Payer: Self-pay | Admitting: Adult Health

## 2023-02-02 ENCOUNTER — Other Ambulatory Visit (HOSPITAL_COMMUNITY): Payer: Self-pay

## 2023-02-02 MED ORDER — AMPHETAMINE-DEXTROAMPHET ER 30 MG PO CP24
30.0000 mg | ORAL_CAPSULE | Freq: Every day | ORAL | 0 refills | Status: DC
Start: 2023-02-02 — End: 2023-03-06
  Filled 2023-02-02: qty 30, 30d supply, fill #0

## 2023-02-06 ENCOUNTER — Other Ambulatory Visit (HOSPITAL_COMMUNITY): Payer: Self-pay

## 2023-02-06 ENCOUNTER — Telehealth: Payer: Self-pay

## 2023-02-06 NOTE — Telephone Encounter (Signed)
 Pharmacy Patient Advocate Encounter  Received notification from [facility] that Prior Authorization for Mounjaro  15MG /0.5ML auto-injectors  has been APPROVED from 02/06/2023 to 02/05/2026. Ran test claim, Copay is $30.00. This test claim was processed through [facility]- copay amounts may vary at other pharmacies due to pharmacy/plan contracts, or as the patient moves through the different stages of their insurance plan.   PA #/Case ID/Reference #: [id number]

## 2023-03-06 ENCOUNTER — Other Ambulatory Visit (HOSPITAL_COMMUNITY): Payer: Self-pay

## 2023-03-06 MED ORDER — AMPHETAMINE-DEXTROAMPHET ER 30 MG PO CP24
30.0000 mg | ORAL_CAPSULE | Freq: Every day | ORAL | 0 refills | Status: DC
Start: 2023-03-06 — End: 2023-04-04
  Filled 2023-03-06: qty 30, 30d supply, fill #0

## 2023-04-04 ENCOUNTER — Other Ambulatory Visit (HOSPITAL_COMMUNITY): Payer: Self-pay

## 2023-04-04 MED ORDER — AMPHETAMINE-DEXTROAMPHET ER 30 MG PO CP24
30.0000 mg | ORAL_CAPSULE | Freq: Every day | ORAL | 0 refills | Status: DC
  Filled 2023-04-04: qty 30, 30d supply, fill #0

## 2023-04-06 ENCOUNTER — Other Ambulatory Visit: Payer: Self-pay | Admitting: Adult Health

## 2023-04-06 ENCOUNTER — Other Ambulatory Visit (HOSPITAL_COMMUNITY): Payer: Self-pay

## 2023-04-06 NOTE — Telephone Encounter (Signed)
 Patient need to schedule for more refills.

## 2023-04-12 ENCOUNTER — Ambulatory Visit: Admitting: Adult Health

## 2023-04-12 VITALS — BP 110/80 | HR 65 | Temp 97.9°F | Wt 344.0 lb

## 2023-04-12 DIAGNOSIS — E1169 Type 2 diabetes mellitus with other specified complication: Secondary | ICD-10-CM

## 2023-04-12 DIAGNOSIS — I10 Essential (primary) hypertension: Secondary | ICD-10-CM | POA: Diagnosis not present

## 2023-04-12 DIAGNOSIS — R600 Localized edema: Secondary | ICD-10-CM

## 2023-04-12 DIAGNOSIS — Z7985 Long-term (current) use of injectable non-insulin antidiabetic drugs: Secondary | ICD-10-CM | POA: Diagnosis not present

## 2023-04-12 DIAGNOSIS — F5101 Primary insomnia: Secondary | ICD-10-CM

## 2023-04-12 DIAGNOSIS — K2100 Gastro-esophageal reflux disease with esophagitis, without bleeding: Secondary | ICD-10-CM

## 2023-04-12 LAB — POCT GLYCOSYLATED HEMOGLOBIN (HGB A1C): Hemoglobin A1C: 4.9 % (ref 4.0–5.6)

## 2023-04-12 MED ORDER — FUROSEMIDE 20 MG PO TABS: 20.0000 mg | ORAL_TABLET | Freq: Every day | ORAL | 0 refills | Status: DC

## 2023-04-12 MED ORDER — LISINOPRIL 10 MG PO TABS: 10.0000 mg | ORAL_TABLET | Freq: Every day | ORAL | 3 refills | Status: DC

## 2023-04-12 MED ORDER — TRAZODONE HCL 50 MG PO TABS: 25.0000 mg | ORAL_TABLET | Freq: Every evening | ORAL | 0 refills | Status: DC | PRN

## 2023-04-12 MED ORDER — MOUNJARO 15 MG/0.5ML ~~LOC~~ SOAJ
15.0000 mg | SUBCUTANEOUS | 1 refills | Status: DC
Start: 2023-04-12 — End: 2023-10-02

## 2023-04-12 MED ORDER — DOXYCYCLINE HYCLATE 100 MG PO CAPS: 100.0000 mg | ORAL_CAPSULE | Freq: Two times a day (BID) | ORAL | 0 refills | Status: DC

## 2023-04-12 MED ORDER — OMEPRAZOLE 20 MG PO CPDR: 20.0000 mg | DELAYED_RELEASE_CAPSULE | Freq: Every day | ORAL | 3 refills | Status: DC

## 2023-04-12 NOTE — Progress Notes (Signed)
 Subjective:    Patient ID: Jordan Trujillo, male    DOB: 06/15/1980, 43 y.o.   MRN: [id number]  HPI 43 year old male who  has a past medical history of Chicken pox, DVT (deep venous thrombosis) (HCC), GERD (gastroesophageal reflux disease), Hypertension, Kidney stones, and Morbid obesity (HCC).  He presents to the office today for 75-month follow-up regarding hypertension/DM Type 2/Obesity.   Hypertension-managed with lisinopril  20 mg. He does endorse dizziness when standing and bending over.  BP Readings from Last 3 Encounters:  04/12/23 110/80  10/11/22 110/80  05/31/22 110/80   Diabetes mellitus type 2-diagnosed in May 2023 at which time his A1c was 6.7.  We trialed him on metformin 500 mg daily but this caused GI issues, we then discontinued this medication and switched him to [facility].  He has been doing well with this medication and we have been able to titrate him up to 15 mg weekly.  His appetite has decreased and he is having less cravings.  He has been eating healthier and he is walking more.    Lab Results  Component Value Date   HGBA1C 4.9 10/11/2022   HGBA1C 5.0 05/31/2022   HGBA1C 6.7 (H) 06/01/2021   Obesity -managed with diet and exercise as well as Mounjaro.  He has been able to lose about 138 lbs.  He has dropped 12 pant sizes since starting mounjaro. He is getting into the gym more often Wt Readings from Last 10 Encounters:  04/12/23 (!) 344 lb (156 kg)  10/11/22 (!) 336 lb (152.4 kg)  05/31/22 (!) 355 lb (161 kg)  01/06/22 (!) 391 lb (177.4 kg)  12/02/21 (!) 398 lb (180.5 kg)  10/26/21 (!) 410 lb 3.2 oz (186.1 kg)  09/07/21 (!) 425 lb (192.8 kg)  07/06/21 (!) 452 lb (205 kg)  06/01/21 (!) 476 lb (215.9 kg)  05/19/21 (!) 478 lb (216.8 kg)   Lower extremity edema - he reports that for the last week he has developed worsening lower extremity edema in his left lower extremity. He reports that he has had pain and redness to his left lower leg. Denies trauma. No  SOB or CP.   Insomnia- long standing history. Has tried multiple medication including Ambien, Melatonin and CBD. He is able to fall asleep but not able to stay for more than 5 hours. He does not drink caffeine at night. Tries to have good sleep hygiene.   He needs refills of Prilosec and lisinopril   Review of Systems Past Medical History:  Diagnosis Date   Chicken pox    DVT (deep venous thrombosis) (HCC)    GERD (gastroesophageal reflux disease)    Hypertension    Kidney stones    Morbid obesity (HCC)     Social History   Socioeconomic History   Marital status: Married    Spouse name: Not on file   Number of children: Not on file   Years of education: Not on file   Highest education level: Not on file  Occupational History   Not on file  Tobacco Use   Smoking status: Every Day    Types: Cigarettes   Smokeless tobacco: Never  Vaping Use   Vaping status: Never Used  Substance and Sexual Activity   Alcohol use: No   Drug use: No   Sexual activity: Not on file  Other Topics Concern   Not on file  Social History Narrative   High School education    Social Drivers  of Health   Financial Resource Strain: Not on file  Food Insecurity: Not on file  Transportation Needs: Not on file  Physical Activity: Not on file  Stress: No Stress Concern Present (04/27/2020)   Received from [facility], [facility] of Occupational Health - Occupational Stress Questionnaire    Feeling of Stress : Not at all  Social Connections: Unknown (05/31/2021)   Received from [facility], [facility]   Social Network    Social Network: Not on file  Intimate Partner Violence: Unknown (05/02/2021)   Received from [facility], [facility]   HITS    Physically Hurt: Not on file    Insult or Talk Down To: Not on file    Threaten Physical Harm: Not on file    Scream or Curse: Not on file    Past Surgical History:  Procedure Laterality Date   KNEE ARTHROSCOPY W/  MENISCAL REPAIR     Left    Family History  Problem Relation Age of Onset   Diabetes Father    Hypertension Father    Arthritis Father    Heart disease Father    Breast cancer Maternal Grandmother     Allergies  Allergen Reactions   Amoxicillin Hives   Morphine Other (See Comments)    angry   Requip [Ropinirole Hcl] Other (See Comments)    Kept awake for 3 days    Current Outpatient Medications on File Prior to Visit  Medication Sig Dispense Refill   ACCU-CHEK GUIDE test strip USE 1 STRIP TO CHECK GLUCOSE UP TO 4 TIMES DAILY AS DIRECTED     Accu-Chek Softclix Lancets lancets SMARTSIG:1 Topical 1-4 Times Daily     amphetamine-dextroamphetamine (ADDERALL XR) 30 MG 24 hr capsule Take 1 capsule by mouth daily 30 capsule 0   blood glucose meter kit and supplies KIT Dispense based on patient and insurance preference. Use up to four times daily as directed. 1 each 0   doxycycline (VIBRAMYCIN) 100 MG capsule Take 1 capsule (100 mg total) by mouth 2 (two) times daily. 14 capsule 0   lisinopril (ZESTRIL) 10 MG tablet Take 1 tablet (10 mg total) by mouth daily. 90 tablet 3   MOUNJARO 15 MG/0.5ML Pen INJECT 1 DOSE (15 MG) SUBCUTANEOUSLY ONCE A WEEK 12 mL 0   omeprazole (PRILOSEC) 20 MG capsule TAKE 1 CAPSULE DAILY 90 capsule 3   No current facility-administered medications on file prior to visit.    BP 110/80   Pulse 65   Temp 97.9 F (36.6 C) (Oral)   Wt (!) 344 lb (156 kg)   SpO2 100%   BMI 46.65 kg/m       Objective:   Physical Exam Vitals and nursing note reviewed.  Constitutional:      Appearance: Normal appearance. He is obese.  Cardiovascular:     Rate and Rhythm: Normal rate and regular rhythm.     Pulses: Normal pulses.     Heart sounds: Normal heart sounds.     Comments: Redness, warmth and tenderness noted from mid shin to ankle. No calf pain or tenderness.  Pulmonary:     Effort: Pulmonary effort is normal.     Breath sounds: Normal breath sounds.   Musculoskeletal:        General: Tenderness present. No swelling.     Left lower leg: 1+ Edema present.  Skin:    General: Skin is warm and dry.     Findings: Erythema present.  Neurological:  General: No focal deficit present.     Mental Status: He is alert and oriented to person, place, and time.  Psychiatric:        Mood and Affect: Mood normal.        Behavior: Behavior normal.        Thought Content: Thought content normal.        Judgment: Judgment normal.       Assessment & Plan:   1. Type 2 diabetes mellitus with other specified complication, without long-term current use of insulin (HCC) (Primary)  - POC HgB A1c- 4.9   2. Long-term current use of injectable noninsulin antidiabetic medication  - POC HgB A1c - tirzepatide (MOUNJARO) 15 MG/0.5ML Pen; Inject 15 mg into the skin once a week.  Dispense: 12 mL; Refill: 1 - Follow up in 3 months   3. Essential hypertension, benign  - lisinopril (ZESTRIL) 10 MG tablet; Take 1 tablet (10 mg total) by mouth daily.  Dispense: 90 tablet; Refill: 3  4. MORBID OBESITY - Continue to eat healthy and exercise  - tirzepatide (MOUNJARO) 15 MG/0.5ML Pen; Inject 15 mg into the skin once a week.  Dispense: 12 mL; Refill: 1  5. Lower extremity edema - Appears cellulitic but need to r/o DVT since it is unilateral.  - US Venous Img Lower Unilateral Left (DVT); Future - doxycycline (VIBRAMYCIN) 100 MG capsule; Take 1 capsule (100 mg total) by mouth 2 (two) times daily.  Dispense: 14 capsule; Refill: 0 - furosemide (LASIX) 20 MG tablet; Take 1 tablet (20 mg total) by mouth daily.  Dispense: 5 tablet; Refill: 0  6. Gastroesophageal reflux disease with esophagitis without hemorrhage  - omeprazole (PRILOSEC) 20 MG capsule; Take 1 capsule (20 mg total) by mouth daily.  Dispense: 90 capsule; Refill: 3  7. Primary insomnia - Will trial him on Trazodone  - traZODone (DESYREL) 50 MG tablet; Take 0.5-1 tablets (25-50 mg total) by mouth at  bedtime as needed for sleep.  Dispense: 30 tablet; Refill: 0  Shirline Frees, NP

## 2023-04-25 ENCOUNTER — Other Ambulatory Visit

## 2023-05-04 ENCOUNTER — Other Ambulatory Visit (HOSPITAL_COMMUNITY): Payer: Self-pay

## 2023-05-04 ENCOUNTER — Other Ambulatory Visit: Payer: Self-pay

## 2023-05-04 MED ORDER — AMPHETAMINE-DEXTROAMPHET ER 30 MG PO CP24
30.0000 mg | ORAL_CAPSULE | Freq: Every day | ORAL | 0 refills | Status: DC
  Filled 2023-05-04: qty 30, 30d supply, fill #0

## 2023-06-04 ENCOUNTER — Other Ambulatory Visit (HOSPITAL_COMMUNITY): Payer: Self-pay

## 2023-06-04 MED ORDER — CLONIDINE HCL 0.2 MG PO TABS
0.4000 mg | ORAL_TABLET | Freq: Every day | ORAL | 2 refills | Status: AC
  Filled 2023-06-04: qty 30, 15d supply, fill #0
  Filled 2023-06-15: qty 30, 15d supply, fill #1
  Filled 2023-06-30: qty 30, 15d supply, fill #2

## 2023-06-04 MED ORDER — AMPHETAMINE-DEXTROAMPHET ER 30 MG PO CP24
30.0000 mg | ORAL_CAPSULE | Freq: Every day | ORAL | 0 refills | Status: DC
Start: 2023-06-04 — End: 2023-07-04
  Filled 2023-06-04: qty 30, 30d supply, fill #0

## 2023-06-30 ENCOUNTER — Encounter: Payer: Self-pay | Admitting: Adult Health

## 2023-07-03 NOTE — Telephone Encounter (Signed)
**Note De-identified  [place] Obfuscation** Please advise 

## 2023-07-04 ENCOUNTER — Other Ambulatory Visit (HOSPITAL_COMMUNITY): Payer: Self-pay

## 2023-07-04 MED ORDER — AMPHETAMINE-DEXTROAMPHET ER 30 MG PO CP24
30.0000 mg | ORAL_CAPSULE | Freq: Every day | ORAL | 0 refills | Status: AC
  Filled 2023-07-04: qty 30, 30d supply, fill #0

## 2023-07-18 ENCOUNTER — Ambulatory Visit: Admitting: Adult Health

## 2023-07-18 ENCOUNTER — Encounter: Payer: Self-pay | Admitting: Adult Health

## 2023-07-18 ENCOUNTER — Ambulatory Visit: Payer: Self-pay | Admitting: Adult Health

## 2023-07-18 VITALS — BP 120/82 | HR 69 | Temp 98.4°F | Resp 20 | Ht 73.0 in | Wt 355.6 lb

## 2023-07-18 DIAGNOSIS — F900 Attention-deficit hyperactivity disorder, predominantly inattentive type: Secondary | ICD-10-CM

## 2023-07-18 DIAGNOSIS — K2100 Gastro-esophageal reflux disease with esophagitis, without bleeding: Secondary | ICD-10-CM

## 2023-07-18 DIAGNOSIS — E1169 Type 2 diabetes mellitus with other specified complication: Secondary | ICD-10-CM | POA: Diagnosis not present

## 2023-07-18 DIAGNOSIS — I10 Essential (primary) hypertension: Secondary | ICD-10-CM

## 2023-07-18 DIAGNOSIS — Z0000 Encounter for general adult medical examination without abnormal findings: Secondary | ICD-10-CM | POA: Diagnosis not present

## 2023-07-18 DIAGNOSIS — Z7985 Long-term (current) use of injectable non-insulin antidiabetic drugs: Secondary | ICD-10-CM | POA: Diagnosis not present

## 2023-07-18 DIAGNOSIS — F5101 Primary insomnia: Secondary | ICD-10-CM

## 2023-07-18 DIAGNOSIS — R600 Localized edema: Secondary | ICD-10-CM

## 2023-07-18 LAB — COMPREHENSIVE METABOLIC PANEL WITH GFR
ALT: 18 U/L (ref 0–53)
AST: 12 U/L (ref 0–37)
Albumin: 4.1 g/dL (ref 3.5–5.2)
Alkaline Phosphatase: 61 U/L (ref 39–117)
BUN: 18 mg/dL (ref 6–23)
CO2: 29 meq/L (ref 19–32)
Calcium: 8.9 mg/dL (ref 8.4–10.5)
Chloride: 105 meq/L (ref 96–112)
Creatinine, Ser: 0.86 mg/dL (ref 0.40–1.50)
GFR: 105.3 mL/min (ref >60.00)
Glucose, Bld: 83 mg/dL (ref 70–99)
Potassium: 4.4 meq/L (ref 3.5–5.1)
Sodium: 140 meq/L (ref 135–145)
Total Bilirubin: 0.5 mg/dL (ref 0.2–1.2)
Total Protein: 6.9 g/dL (ref 6.0–8.3)

## 2023-07-18 LAB — MICROALBUMIN / CREATININE URINE RATIO
Creatinine,U: 138.3 mg/dL
Microalb Creat Ratio: 5.6 mg/g (ref 0.0–30.0)
Microalb, Ur: 0.8 mg/dL (ref 0.0–1.9)

## 2023-07-18 LAB — CBC
HCT: 41.8 % (ref 39.0–52.0)
Hemoglobin: 14.1 g/dL (ref 13.0–17.0)
MCHC: 33.6 g/dL (ref 30.0–36.0)
MCV: 90.7 fl (ref 78.0–100.0)
Platelets: 278 10*3/uL (ref 150.0–400.0)
RBC: 4.61 Mil/uL (ref 4.22–5.81)
RDW: 13.2 % (ref 11.5–15.5)
WBC: 7.6 10*3/uL (ref 4.0–10.5)

## 2023-07-18 LAB — TSH: TSH: 0.96 u[IU]/mL (ref 0.35–5.50)

## 2023-07-18 LAB — LIPID PANEL
Cholesterol: 165 mg/dL (ref 0–200)
HDL: 49 mg/dL (ref >39.00)
LDL Cholesterol: 107 mg/dL — ABNORMAL HIGH (ref 0–99)
NonHDL: 115.9
Total CHOL/HDL Ratio: 3
Triglycerides: 45 mg/dL (ref 0.0–149.0)
VLDL: 9 mg/dL (ref 0.0–40.0)

## 2023-07-18 LAB — HEMOGLOBIN A1C: Hgb A1c MFr Bld: 5.2 % (ref 4.6–6.5)

## 2023-07-18 MED ORDER — FUROSEMIDE 20 MG PO TABS: 20.0000 mg | ORAL_TABLET | Freq: Every day | ORAL | 0 refills | Status: DC | PRN

## 2023-07-18 NOTE — Patient Instructions (Signed)
 It was great seeing you today   We will follow up with you regarding your lab work   Please let me know if you need anything

## 2023-07-18 NOTE — Progress Notes (Addendum)
 Subjective:    Patient ID: Jordan Trujillo, male    DOB: 12/26/1980, 43 y.o.   MRN: [id number]  HPI Patient presents for yearly preventative medicine examination. He is a pleasant 43 year old male who  has a past medical history of Chicken pox, DVT (deep venous thrombosis) (HCC), GERD (gastroesophageal reflux disease), Hypertension, Kidney stones, and Morbid obesity (HCC).  Hypertension-managed with lisinopril  10 mg. He does endorse dizziness when standing and bending over.  BP Readings from Last 3 Encounters:  07/18/23 120/82  04/12/23 110/80  10/11/22 110/80   Diabetes mellitus type 2-diagnosed in May 2023 at which time his A1c was 6.7.  We trialed him on metformin  500 mg daily but this caused GI issues, we then discontinued this medication and switched him to Mounjaro .  He has been doing well with this medication and we have been able to titrate him up to 15 mg weekly.  His appetite has decreased and he is having less cravings.  He has been eating healthier and he is walking more.    Lab Results  Component Value Date   HGBA1C 4.9 04/12/2023   HGBA1C 4.9 10/11/2022   HGBA1C 5.0 05/31/2022   Obesity -managed with diet and exercise as well as Mounjaro  15 mg ( he reports that he had not taken his shot for about a month and restarted about a week ago).   He has been able to lose greater than 100 pounds. He has not been going to the gym but is lifting weights.  Wt Readings from Last 20 Encounters:  07/18/23 (!) 355 lb 9.6 oz (161.3 kg)  04/12/23 (!) 344 lb (156 kg)  10/11/22 (!) 336 lb (152.4 kg)  05/31/22 (!) 355 lb (161 kg)  01/06/22 (!) 391 lb (177.4 kg)  12/02/21 (!) 398 lb (180.5 kg)  10/26/21 (!) 410 lb 3.2 oz (186.1 kg)  09/07/21 (!) 425 lb (192.8 kg)  07/06/21 (!) 452 lb (205 kg)  06/01/21 (!) 476 lb (215.9 kg)  05/19/21 (!) 478 lb (216.8 kg)  12/29/20 (!) 446 lb (202.3 kg)  12/22/20 (!) 445 lb (201.9 kg)  12/08/20 (!) 459 lb (208.2 kg)  11/03/20 (!) 450 lb (204.1 kg)   09/22/20 (!) 452 lb (205 kg)  08/25/20 (!) 472 lb (214.1 kg)  04/21/20 (!) 459 lb 3.2 oz (208.3 kg)  03/24/20 (!) 482 lb (218.6 kg)  02/27/20 (!) 460 lb (208.7 kg)   Insomnia- long standing history. Has tried multiple medication including Ambien, Melatonin and CBD. Most recently tried Trazadone and clonidine  which did not help. He is also working a lot - upwards to 20 hours a day.   GERD - managed with Prilosec 20 mg daily. He feels well controlled.   ADHD - managed by [facility]. He is currently prescrbed Adderall XR 30 mg daily. He feels like the medication is not working as long as it has in the past.    Lower extremity edema - uses lasix  20 mg Prn for lower extremity edema.   All immunizations and health maintenance protocols were reviewed with the patient and needed orders were placed.  Appropriate screening laboratory values were ordered for the patient including screening of hyperlipidemia, renal function and hepatic function.  Medication reconciliation,  past medical history, social history, problem list and allergies were reviewed in detail with the patient  Goals were established with regard to weight loss, exercise, and  diet in compliance with medications  Review of Systems  Constitutional: Negative.  HENT: Negative.    Eyes: Negative.   Respiratory: Negative.    Cardiovascular: Negative.   Gastrointestinal: Negative.   Endocrine: Negative.   Genitourinary: Negative.   Musculoskeletal: Negative.   Skin: Negative.   Allergic/Immunologic: Negative.   Neurological: Negative.   Hematological: Negative.   Psychiatric/Behavioral: Negative.    All other systems reviewed and are negative.  Past Medical History:  Diagnosis Date   Chicken pox    DVT (deep venous thrombosis) (HCC)    GERD (gastroesophageal reflux disease)    Hypertension    Kidney stones    Morbid obesity (HCC)     Social History   Socioeconomic History   Marital status:  Married    Spouse name: Not on file   Number of children: Not on file   Years of education: Not on file   Highest education level: Not on file  Occupational History   Not on file  Tobacco Use   Smoking status: Every Day    Types: Cigarettes   Smokeless tobacco: Never  Vaping Use   Vaping status: Never Used  Substance and Sexual Activity   Alcohol use: No   Drug use: No   Sexual activity: Not on file  Other Topics Concern   Not on file  Social History Narrative   High School education    Social Drivers of Health   Financial Resource Strain: Not on file  Food Insecurity: Not on file  Transportation Needs: Not on file  Physical Activity: Not on file  Stress: No Stress Concern Present (04/27/2020)   Received from [facility] of Occupational Health - Occupational Stress Questionnaire    Feeling of Stress : Not at all  Social Connections: Unknown (05/31/2021)   Received from [facility]   Social Network    Social Network: Not on file  Intimate Partner Violence: Unknown (05/02/2021)   Received from [facility]   HITS    Physically Hurt: Not on file    Insult or Talk Down To: Not on file    Threaten Physical Harm: Not on file    Scream or Curse: Not on file    Past Surgical History:  Procedure Laterality Date   KNEE ARTHROSCOPY W/ MENISCAL REPAIR     Left    Family History  Problem Relation Age of Onset   Diabetes Father    Hypertension Father    Arthritis Father    Heart disease Father    Breast cancer Maternal Grandmother     Allergies  Allergen Reactions   Amoxicillin Hives   Morphine Other (See Comments)    angry   Requip [Ropinirole Hcl] Other (See Comments)    Kept awake for 3 days    Current Outpatient Medications on File Prior to Visit  Medication Sig Dispense Refill   ACCU-CHEK GUIDE test strip USE 1 STRIP TO CHECK GLUCOSE UP TO 4 TIMES DAILY AS DIRECTED     Accu-Chek Softclix Lancets lancets SMARTSIG:1 Topical 1-4 Times  Daily     amphetamine -dextroamphetamine  (ADDERALL XR) 30 MG 24 hr capsule Take 1 capsule (30 mg total) by mouth daily. 30 capsule 0   amphetamine -dextroamphetamine  (ADDERALL XR) 30 MG 24 hr capsule Take 1 capsule (30 mg total) by mouth daily. 30 capsule 0   blood glucose meter kit and supplies KIT Dispense based on patient and insurance preference. Use up to four times daily as directed. 1 each 0   cloNIDine  (CATAPRES ) 0.2 MG tablet Take 2 tablets (0.4  mg total) by mouth at bedtime. 60 tablet 2   lisinopril  (ZESTRIL ) 10 MG tablet Take 1 tablet (10 mg total) by mouth daily. 90 tablet 3   omeprazole  (PRILOSEC) 20 MG capsule Take 1 capsule (20 mg total) by mouth daily. 90 capsule 3   tirzepatide  (MOUNJARO ) 15 MG/0.5ML Pen Inject 15 mg into the skin once a week. 12 mL 1   traZODone  (DESYREL ) 50 MG tablet Take 0.5-1 tablets (25-50 mg total) by mouth at bedtime as needed for sleep. 30 tablet 0   No current facility-administered medications on file prior to visit.    BP 120/82 (BP Location: Left Arm, Patient Position: Sitting, Cuff Size: Large)   Pulse 69   Temp 98.4 F (36.9 C) (Oral)   Resp 20   Ht 6' 1 (1.854 m)   Wt (!) 355 lb 9.6 oz (161.3 kg)   SpO2 99%   BMI 46.92 kg/m       Objective:   Physical Exam Vitals and nursing note reviewed.  Constitutional:      General: He is not in acute distress.    Appearance: Normal appearance. He is obese. He is not ill-appearing.  HENT:     Head: Normocephalic and atraumatic.     Right Ear: Tympanic membrane, ear canal and external ear normal. There is no impacted cerumen.     Left Ear: Tympanic membrane, ear canal and external ear normal. There is no impacted cerumen.     Nose: Nose normal. No congestion or rhinorrhea.     Mouth/Throat:     Mouth: Mucous membranes are moist.     Pharynx: Oropharynx is clear.   Eyes:     Extraocular Movements: Extraocular movements intact.     Conjunctiva/sclera: Conjunctivae normal.     Pupils: Pupils  are equal, round, and reactive to light.   Neck:     Vascular: No carotid bruit.   Cardiovascular:     Rate and Rhythm: Normal rate and regular rhythm.     Pulses: Normal pulses.     Heart sounds: No murmur heard.    No friction rub. No gallop.  Pulmonary:     Effort: Pulmonary effort is normal.     Breath sounds: Normal breath sounds.  Abdominal:     General: Abdomen is flat. Bowel sounds are normal. There is no distension.     Palpations: Abdomen is soft. There is no mass.     Tenderness: There is no abdominal tenderness. There is no guarding or rebound.     Hernia: No hernia is present.   Musculoskeletal:        General: Normal range of motion.     Cervical back: Normal range of motion and neck supple.     Right lower leg: 1+ Pitting Edema present.     Left lower leg: 1+ Pitting Edema present.  Lymphadenopathy:     Cervical: No cervical adenopathy.   Skin:    General: Skin is warm and dry.     Capillary Refill: Capillary refill takes less than 2 seconds.   Neurological:     General: No focal deficit present.     Mental Status: He is alert and oriented to person, place, and time.   Psychiatric:        Mood and Affect: Mood normal.        Behavior: Behavior normal.        Thought Content: Thought content normal.        Judgment: Judgment normal.  Assessment & Plan:  1. Routine general medical examination at a health care facility (Primary) Today patient counseled on age appropriate routine health concerns for screening and prevention, each reviewed and up to date or declined. Immunizations reviewed and up to date or declined. Labs ordered and reviewed. Risk factors for depression reviewed and negative. Hearing function and visual acuity are intact. ADLs screened and addressed as needed. Functional ability and level of safety reviewed and appropriate. Education, counseling and referrals performed based on assessed risks today. Patient provided with a copy of  personalized plan for preventive services. - Needs to cut back on working hours  - Follow up in one year or sooner if needed  2. Essential hypertension, benign - Well controlled. No change in medication  - Lipid panel; Future - TSH; Future - CBC; Future - Comprehensive metabolic panel with GFR; Future  3. Type 2 diabetes mellitus with other specified complication, without long-term current use of insulin  (HCC) - Follow up in 6 months  - Eat healthy and exercise  - Lipid panel; Future - TSH; Future - CBC; Future - Comprehensive metabolic panel with GFR; Future - Microalbumin/Creatinine Ratio, Urine; Future - Hemoglobin A1c; Future  4. Long-term current use of injectable noninsulin antidiabetic medication - Continue with mounjaro   - Lipid panel; Future - TSH; Future - CBC; Future - Comprehensive metabolic panel with GFR; Future - Microalbumin/Creatinine Ratio, Urine; Future - Hemoglobin A1c; Future  5. MORBID OBESITY - Weight up, recently restarted Mounjaro   - Lipid panel; Future - TSH; Future - CBC; Future - Comprehensive metabolic panel with GFR; Future  6. Primary insomnia - He does have diagnosed sleep apnea but is unable to wear his mask due to claustrophobia and I am sure this is playing into his insomnia. Working 20 hour days is also not sustainable and I encouraged him to cut back on time at work .  - Lipid panel; Future - TSH; Future - CBC; Future - Comprehensive metabolic panel with GFR; Future  7. Gastroesophageal reflux disease with esophagitis without hemorrhage - Continue PPI  - Lipid panel; Future - TSH; Future - CBC; Future - Comprehensive metabolic panel with GFR; Future  8. Attention deficit hyperactivity disorder (ADHD), predominantly inattentive type - per [facility]   9. Lower extremity edema  - Lipid panel; Future - TSH; Future - CBC; Future - Comprehensive metabolic panel with GFR; Future - furosemide  (LASIX ) 20 MG  tablet; Take 1 tablet (20 mg total) by mouth daily as needed.  Dispense: 90 tablet; Refill: 0  Alto Atta, NP

## 2023-07-21 IMAGING — DX DG KNEE COMPLETE 4+V*R*
4 series · 4 of 4 positions shown · non-contrast
Comparison: Right tibia and fibula radiographs 08/15/2020

CLINICAL DATA: Bilateral knee pain.

EXAM:
RIGHT KNEE - COMPLETE 4+ VIEW; LEFT KNEE - COMPLETE 4+ VIEW

[knee tunnel]
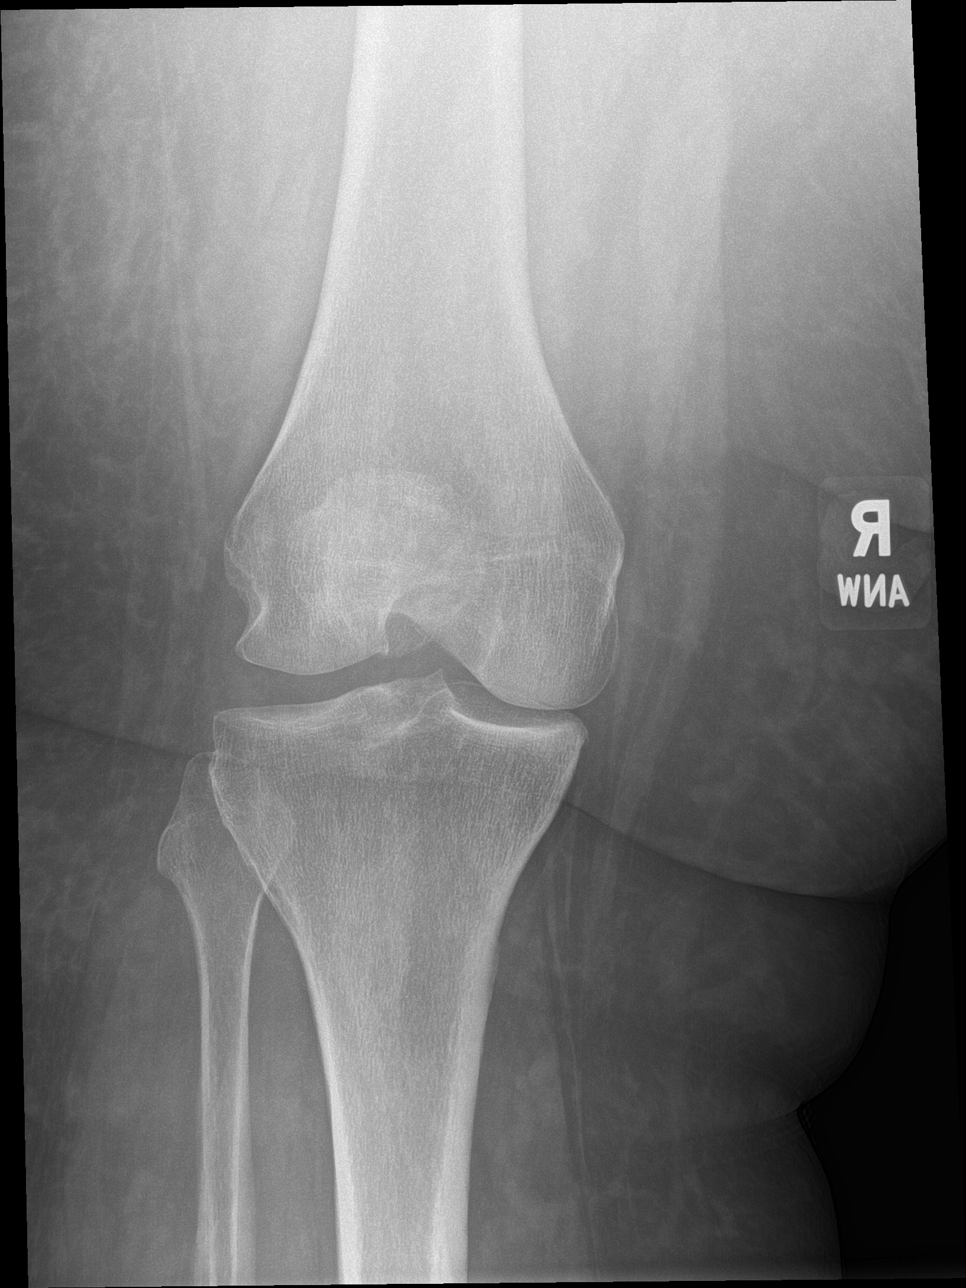

[patella sunrise]
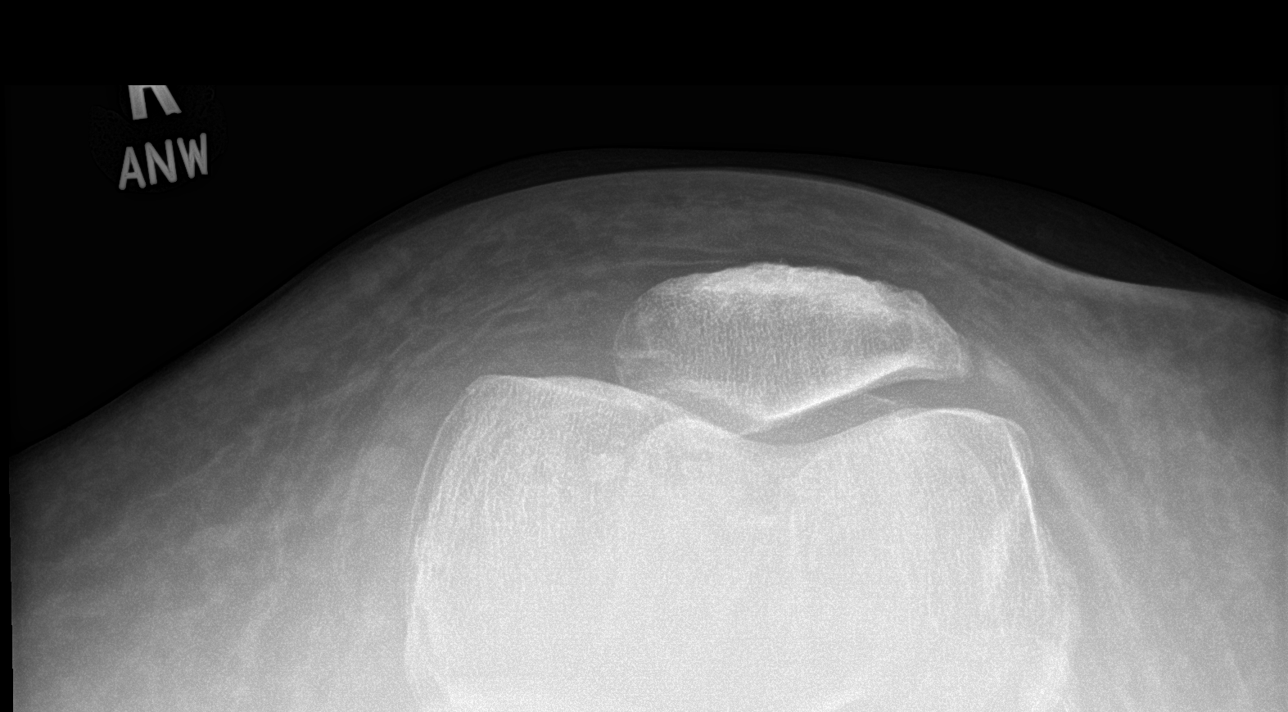

[knee ap]
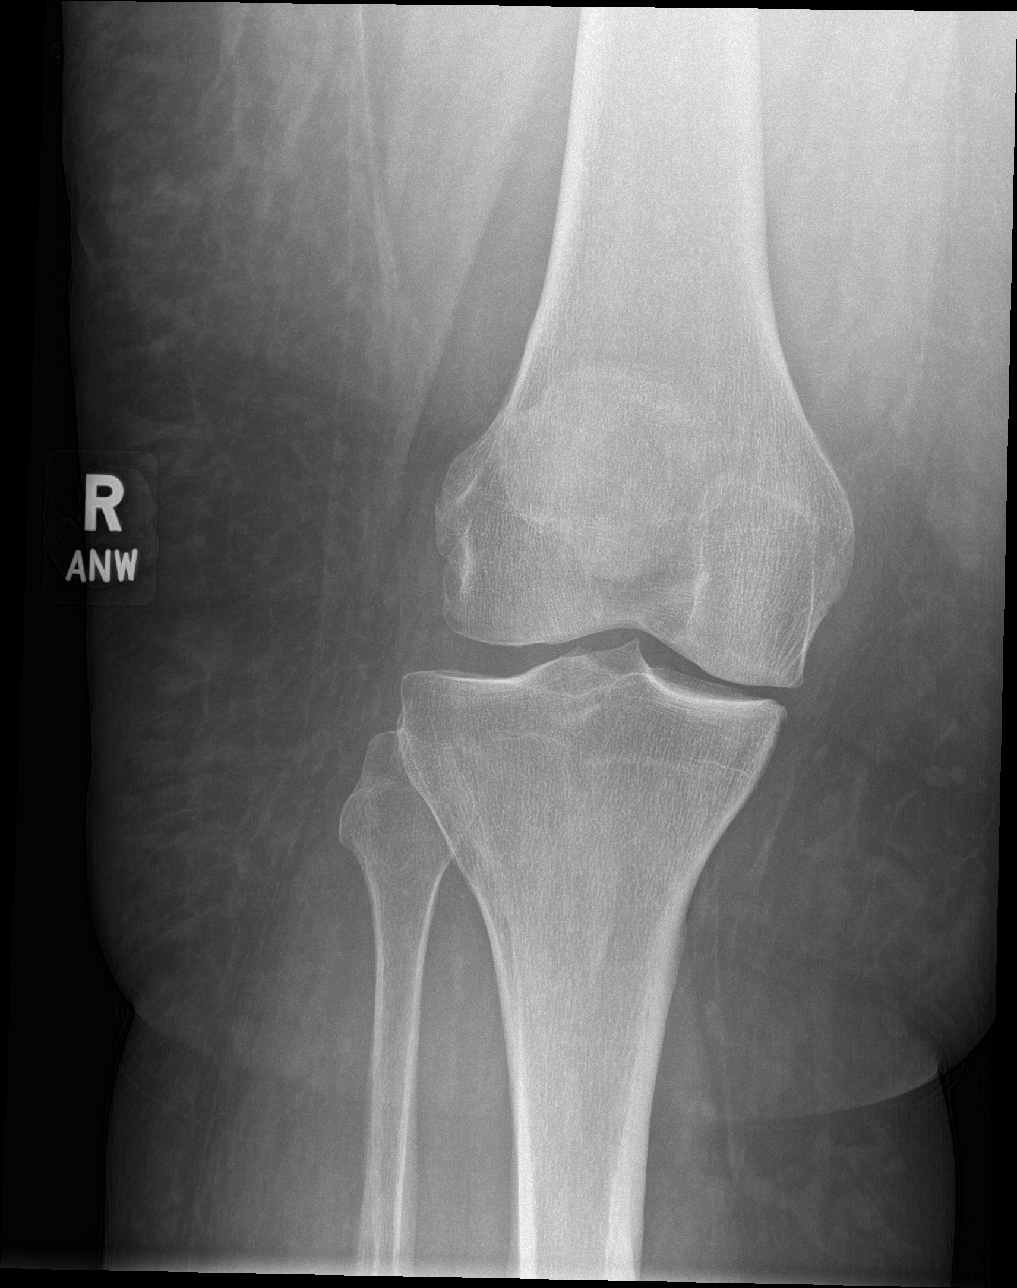

[knee lat]
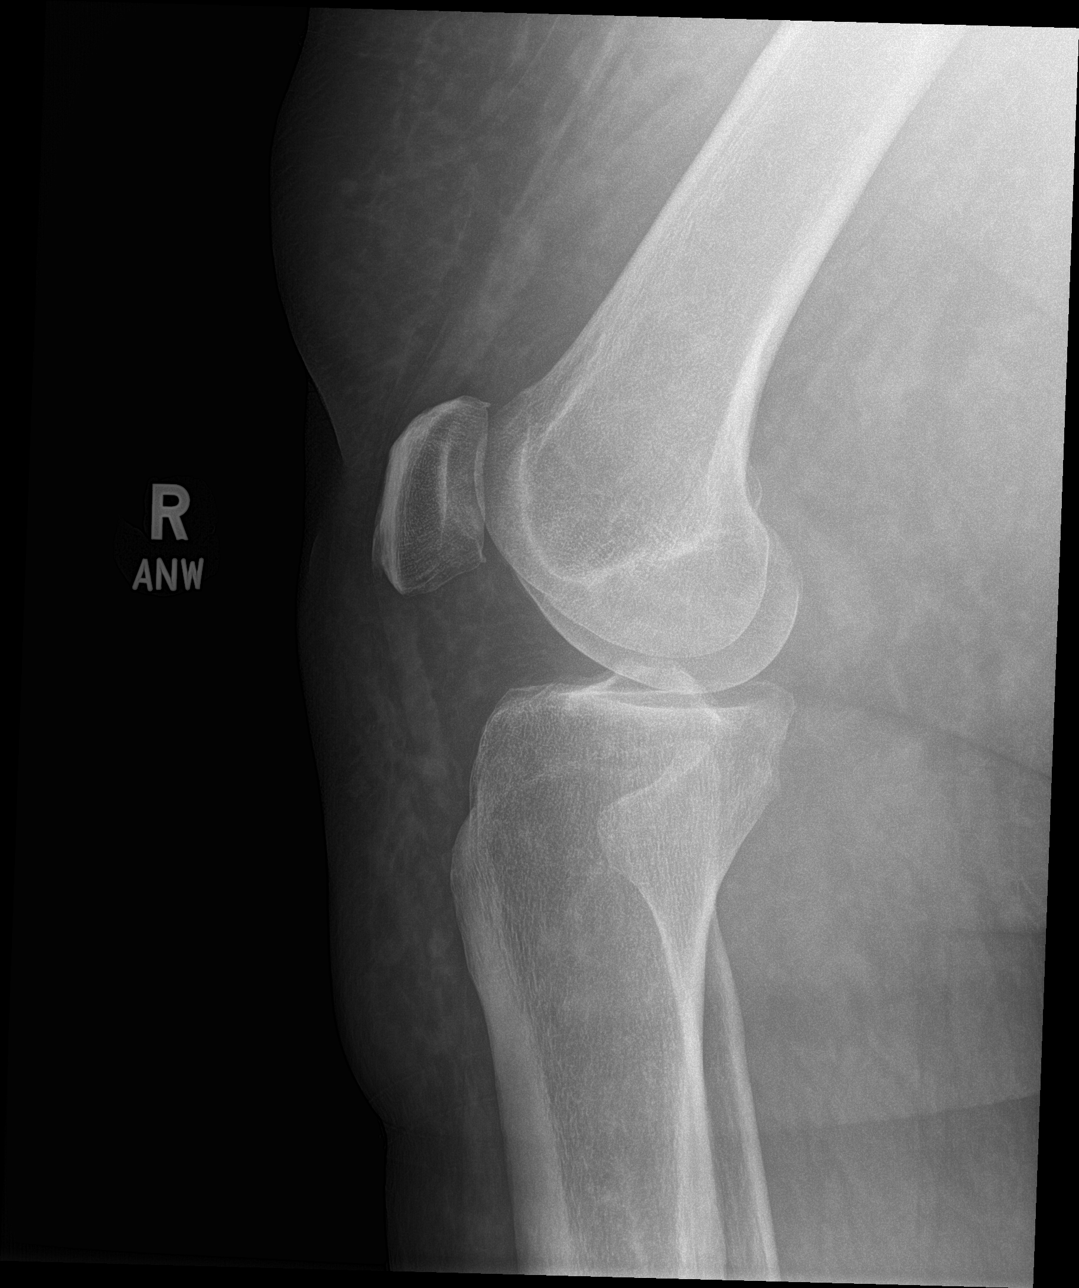

[4 of 4 positions shown; findings below may reference images not displayed]

FINDINGS: Right knee:

Mild medial compartment joint space narrowing. Mild patellofemoral
joint space narrowing with mild superior, inferior, and lateral
patellar degenerative osteophytes. Minimal enthesopathic change at
the patellar tendon origin at the inferior patella. No joint
effusion. No acute fracture or dislocation.

Left knee:

Mild medial compartment joint space narrowing. No joint effusion. No
acute fracture or dislocation.
IMPRESSION: Mild bilateral medial compartment and mild right patellofemoral
compartment osteoarthritis.

## 2023-07-25 MED ORDER — ATORVASTATIN CALCIUM 10 MG PO TABS: 10.0000 mg | ORAL_TABLET | Freq: Every day | ORAL | 0 refills | Status: AC

## 2023-08-02 ENCOUNTER — Other Ambulatory Visit (HOSPITAL_COMMUNITY): Payer: Self-pay

## 2023-08-02 MED ORDER — AMPHETAMINE-DEXTROAMPHET ER 30 MG PO CP24
30.0000 mg | ORAL_CAPSULE | Freq: Every day | ORAL | 0 refills | Status: AC
  Filled 2023-08-02: qty 30, 30d supply, fill #0

## 2023-09-03 ENCOUNTER — Other Ambulatory Visit (HOSPITAL_COMMUNITY): Payer: Self-pay

## 2023-09-03 MED ORDER — AMPHETAMINE-DEXTROAMPHET ER 30 MG PO CP24
30.0000 mg | ORAL_CAPSULE | Freq: Every day | ORAL | 0 refills | Status: AC
  Filled 2023-09-03: qty 30, 30d supply, fill #0

## 2023-09-05 ENCOUNTER — Other Ambulatory Visit (HOSPITAL_COMMUNITY): Payer: Self-pay

## 2023-09-26 ENCOUNTER — Encounter: Payer: Self-pay | Admitting: Adult Health

## 2023-09-26 ENCOUNTER — Other Ambulatory Visit: Payer: Self-pay | Admitting: Adult Health

## 2023-09-26 DIAGNOSIS — Z7985 Long-term (current) use of injectable non-insulin antidiabetic drugs: Secondary | ICD-10-CM

## 2023-09-26 DIAGNOSIS — K2100 Gastro-esophageal reflux disease with esophagitis, without bleeding: Secondary | ICD-10-CM

## 2023-09-26 DIAGNOSIS — R600 Localized edema: Secondary | ICD-10-CM

## 2023-10-02 ENCOUNTER — Other Ambulatory Visit (HOSPITAL_COMMUNITY): Payer: Self-pay

## 2023-10-02 MED ORDER — AMPHETAMINE-DEXTROAMPHET ER 30 MG PO CP24
30.0000 mg | ORAL_CAPSULE | Freq: Every morning | ORAL | 0 refills | Status: AC
  Filled 2023-10-02: qty 30, 30d supply, fill #0

## 2023-10-02 MED ORDER — MOUNJARO 15 MG/0.5ML ~~LOC~~ SOAJ: 15.0000 mg | SUBCUTANEOUS | 1 refills | Status: AC

## 2023-10-02 MED ORDER — OMEPRAZOLE 20 MG PO CPDR: 20.0000 mg | DELAYED_RELEASE_CAPSULE | Freq: Every day | ORAL | 3 refills | Status: AC

## 2023-10-02 MED ORDER — OMEPRAZOLE 20 MG PO CPDR: 20.0000 mg | DELAYED_RELEASE_CAPSULE | Freq: Every day | ORAL | 3 refills | Status: DC

## 2023-10-04 ENCOUNTER — Other Ambulatory Visit (HOSPITAL_COMMUNITY): Payer: Self-pay

## 2023-11-05 ENCOUNTER — Other Ambulatory Visit (HOSPITAL_COMMUNITY): Payer: Self-pay

## 2023-11-05 MED ORDER — AMPHETAMINE-DEXTROAMPHET ER 30 MG PO CP24
30.0000 mg | ORAL_CAPSULE | Freq: Every morning | ORAL | 0 refills | Status: AC
  Filled 2023-11-05: qty 30, 30d supply, fill #0

## 2023-11-06 ENCOUNTER — Other Ambulatory Visit (HOSPITAL_COMMUNITY): Payer: Self-pay

## 2023-12-04 ENCOUNTER — Other Ambulatory Visit (HOSPITAL_COMMUNITY): Payer: Self-pay

## 2023-12-04 MED ORDER — AMPHETAMINE-DEXTROAMPHET ER 30 MG PO CP24
30.0000 mg | ORAL_CAPSULE | Freq: Every morning | ORAL | 0 refills | Status: DC
  Filled 2023-12-04: qty 30, 30d supply, fill #0

## 2023-12-25 ENCOUNTER — Other Ambulatory Visit: Payer: Self-pay | Admitting: Adult Health

## 2023-12-25 DIAGNOSIS — R600 Localized edema: Secondary | ICD-10-CM

## 2023-12-25 DIAGNOSIS — I10 Essential (primary) hypertension: Secondary | ICD-10-CM

## 2024-01-04 ENCOUNTER — Other Ambulatory Visit (HOSPITAL_COMMUNITY): Payer: Self-pay

## 2024-01-04 MED ORDER — AMPHETAMINE-DEXTROAMPHET ER 30 MG PO CP24
30.0000 mg | ORAL_CAPSULE | Freq: Every morning | ORAL | 0 refills | Status: DC
  Filled 2024-01-04: qty 30, 30d supply, fill #0

## 2024-02-04 ENCOUNTER — Other Ambulatory Visit: Payer: Self-pay

## 2024-02-04 ENCOUNTER — Other Ambulatory Visit (HOSPITAL_COMMUNITY): Payer: Self-pay

## 2024-02-04 MED ORDER — AMPHETAMINE-DEXTROAMPHET ER 30 MG PO CP24
30.0000 mg | ORAL_CAPSULE | Freq: Every morning | ORAL | 0 refills | Status: DC
  Filled 2024-02-04: qty 30, 30d supply, fill #0

## 2024-02-06 ENCOUNTER — Other Ambulatory Visit (HOSPITAL_COMMUNITY): Payer: Self-pay

## 2024-03-03 ENCOUNTER — Other Ambulatory Visit (HOSPITAL_COMMUNITY): Payer: Self-pay

## 2024-03-03 MED ORDER — AMPHETAMINE-DEXTROAMPHET ER 10 MG PO CP24
10.0000 mg | ORAL_CAPSULE | Freq: Every morning | ORAL | 0 refills | Status: AC
  Filled 2024-03-03: qty 30, 30d supply, fill #0

## 2024-03-03 MED ORDER — AMPHETAMINE-DEXTROAMPHET ER 30 MG PO CP24
30.0000 mg | ORAL_CAPSULE | Freq: Every morning | ORAL | 0 refills | Status: AC
  Filled 2024-03-03 – 2024-03-05 (×2): qty 30, 30d supply, fill #0

## 2024-03-05 ENCOUNTER — Other Ambulatory Visit (HOSPITAL_COMMUNITY): Payer: Self-pay

## 2024-03-05 ENCOUNTER — Other Ambulatory Visit: Payer: Self-pay
